# Patient Record
Sex: Female | Born: 1975 | Race: White | Hispanic: No | Marital: Married | Smoking: Never smoker
Health system: Southern US, Community
[De-identification: ages and names within clinical notes are randomized; demographics above are authoritative.]

## PROBLEM LIST (undated history)

## (undated) DIAGNOSIS — N6310 Unspecified lump in the right breast, unspecified quadrant: Secondary | ICD-10-CM

## (undated) DIAGNOSIS — D649 Anemia, unspecified: Secondary | ICD-10-CM

## (undated) DIAGNOSIS — K219 Gastro-esophageal reflux disease without esophagitis: Secondary | ICD-10-CM

## (undated) HISTORY — PX: NO PAST SURGERIES: SHX2092

---

## 1997-09-18 ENCOUNTER — Other Ambulatory Visit: Admission: RE | Admit: 1997-09-18 | Discharge: 1997-09-18 | Payer: Self-pay | Admitting: Obstetrics and Gynecology

## 1998-09-24 ENCOUNTER — Other Ambulatory Visit: Admission: RE | Admit: 1998-09-24 | Discharge: 1998-09-24 | Payer: Self-pay | Admitting: Obstetrics and Gynecology

## 1998-11-06 ENCOUNTER — Encounter: Payer: Self-pay | Admitting: Obstetrics and Gynecology

## 1998-11-06 ENCOUNTER — Ambulatory Visit (HOSPITAL_COMMUNITY): Admission: RE | Admit: 1998-11-06 | Discharge: 1998-11-06 | Payer: Self-pay | Admitting: Obstetrics and Gynecology

## 1998-11-19 ENCOUNTER — Other Ambulatory Visit: Admission: RE | Admit: 1998-11-19 | Discharge: 1998-11-19 | Payer: Self-pay | Admitting: Obstetrics and Gynecology

## 1999-07-27 ENCOUNTER — Other Ambulatory Visit: Admission: RE | Admit: 1999-07-27 | Discharge: 1999-07-27 | Payer: Self-pay | Admitting: Obstetrics and Gynecology

## 1999-07-28 ENCOUNTER — Other Ambulatory Visit: Admission: RE | Admit: 1999-07-28 | Discharge: 1999-07-28 | Payer: Self-pay | Admitting: Obstetrics and Gynecology

## 2000-02-16 ENCOUNTER — Other Ambulatory Visit: Admission: RE | Admit: 2000-02-16 | Discharge: 2000-02-16 | Payer: Self-pay | Admitting: Obstetrics and Gynecology

## 2000-02-17 ENCOUNTER — Other Ambulatory Visit: Admission: RE | Admit: 2000-02-17 | Discharge: 2000-02-17 | Payer: Self-pay | Admitting: Obstetrics and Gynecology

## 2000-07-28 ENCOUNTER — Other Ambulatory Visit: Admission: RE | Admit: 2000-07-28 | Discharge: 2000-07-28 | Payer: Self-pay | Admitting: Obstetrics and Gynecology

## 2000-08-04 ENCOUNTER — Other Ambulatory Visit: Admission: RE | Admit: 2000-08-04 | Discharge: 2000-08-04 | Payer: Self-pay | Admitting: Obstetrics and Gynecology

## 2000-12-07 ENCOUNTER — Other Ambulatory Visit: Admission: RE | Admit: 2000-12-07 | Discharge: 2000-12-07 | Payer: Self-pay | Admitting: Obstetrics and Gynecology

## 2001-03-06 ENCOUNTER — Inpatient Hospital Stay (HOSPITAL_COMMUNITY): Admission: AD | Admit: 2001-03-06 | Discharge: 2001-03-08 | Payer: Self-pay | Admitting: Obstetrics and Gynecology

## 2001-03-18 ENCOUNTER — Inpatient Hospital Stay (HOSPITAL_COMMUNITY): Admission: AD | Admit: 2001-03-18 | Discharge: 2001-03-18 | Payer: Self-pay | Admitting: Obstetrics and Gynecology

## 2001-04-06 ENCOUNTER — Ambulatory Visit (HOSPITAL_BASED_OUTPATIENT_CLINIC_OR_DEPARTMENT_OTHER): Admission: RE | Admit: 2001-04-06 | Discharge: 2001-04-06 | Payer: Self-pay | Admitting: *Deleted

## 2001-04-12 ENCOUNTER — Other Ambulatory Visit: Admission: RE | Admit: 2001-04-12 | Discharge: 2001-04-12 | Payer: Self-pay | Admitting: Obstetrics and Gynecology

## 2002-03-12 ENCOUNTER — Other Ambulatory Visit: Admission: RE | Admit: 2002-03-12 | Discharge: 2002-03-12 | Payer: Self-pay | Admitting: Obstetrics and Gynecology

## 2003-03-24 ENCOUNTER — Other Ambulatory Visit: Admission: RE | Admit: 2003-03-24 | Discharge: 2003-03-24 | Payer: Self-pay | Admitting: Obstetrics and Gynecology

## 2004-07-22 ENCOUNTER — Other Ambulatory Visit: Admission: RE | Admit: 2004-07-22 | Discharge: 2004-07-22 | Payer: Self-pay | Admitting: Obstetrics and Gynecology

## 2016-05-20 DIAGNOSIS — Z23 Encounter for immunization: Secondary | ICD-10-CM | POA: Diagnosis not present

## 2016-06-24 DIAGNOSIS — K219 Gastro-esophageal reflux disease without esophagitis: Secondary | ICD-10-CM | POA: Diagnosis not present

## 2016-06-24 DIAGNOSIS — R1011 Right upper quadrant pain: Secondary | ICD-10-CM | POA: Diagnosis not present

## 2016-06-24 DIAGNOSIS — R140 Abdominal distension (gaseous): Secondary | ICD-10-CM | POA: Diagnosis not present

## 2016-06-24 DIAGNOSIS — R634 Abnormal weight loss: Secondary | ICD-10-CM | POA: Diagnosis not present

## 2016-06-24 DIAGNOSIS — J45909 Unspecified asthma, uncomplicated: Secondary | ICD-10-CM | POA: Diagnosis not present

## 2016-06-27 ENCOUNTER — Other Ambulatory Visit: Payer: Self-pay | Admitting: Internal Medicine

## 2016-06-27 DIAGNOSIS — R946 Abnormal results of thyroid function studies: Secondary | ICD-10-CM | POA: Diagnosis not present

## 2016-06-27 DIAGNOSIS — R1011 Right upper quadrant pain: Secondary | ICD-10-CM

## 2016-07-05 ENCOUNTER — Ambulatory Visit
Admission: RE | Admit: 2016-07-05 | Discharge: 2016-07-05 | Disposition: A | Discharge status: Home / Self Care | Payer: BLUE CROSS/BLUE SHIELD | Source: Ambulatory Visit | Attending: Internal Medicine | Admitting: Internal Medicine

## 2016-07-05 DIAGNOSIS — R1011 Right upper quadrant pain: Secondary | ICD-10-CM

## 2016-07-05 DIAGNOSIS — R1084 Generalized abdominal pain: Secondary | ICD-10-CM | POA: Diagnosis not present

## 2016-07-14 DIAGNOSIS — K219 Gastro-esophageal reflux disease without esophagitis: Secondary | ICD-10-CM | POA: Diagnosis not present

## 2016-07-14 DIAGNOSIS — R140 Abdominal distension (gaseous): Secondary | ICD-10-CM | POA: Diagnosis not present

## 2016-07-14 DIAGNOSIS — J45909 Unspecified asthma, uncomplicated: Secondary | ICD-10-CM | POA: Diagnosis not present

## 2016-07-14 DIAGNOSIS — R946 Abnormal results of thyroid function studies: Secondary | ICD-10-CM | POA: Diagnosis not present

## 2016-07-25 DIAGNOSIS — Z01419 Encounter for gynecological examination (general) (routine) without abnormal findings: Secondary | ICD-10-CM | POA: Diagnosis not present

## 2016-07-25 DIAGNOSIS — Z6820 Body mass index (BMI) 20.0-20.9, adult: Secondary | ICD-10-CM | POA: Diagnosis not present

## 2016-08-15 DIAGNOSIS — Z1231 Encounter for screening mammogram for malignant neoplasm of breast: Secondary | ICD-10-CM | POA: Diagnosis not present

## 2016-08-18 ENCOUNTER — Other Ambulatory Visit: Payer: Self-pay | Admitting: Obstetrics and Gynecology

## 2016-08-18 DIAGNOSIS — R928 Other abnormal and inconclusive findings on diagnostic imaging of breast: Secondary | ICD-10-CM

## 2016-08-22 ENCOUNTER — Encounter: Payer: Self-pay | Admitting: Radiology

## 2016-08-22 ENCOUNTER — Other Ambulatory Visit: Payer: Self-pay | Admitting: Obstetrics and Gynecology

## 2016-08-22 ENCOUNTER — Ambulatory Visit
Admission: RE | Admit: 2016-08-22 | Discharge: 2016-08-22 | Disposition: A | Discharge status: Home / Self Care | Payer: BLUE CROSS/BLUE SHIELD | Source: Ambulatory Visit | Attending: Obstetrics and Gynecology | Admitting: Obstetrics and Gynecology

## 2016-08-22 DIAGNOSIS — R928 Other abnormal and inconclusive findings on diagnostic imaging of breast: Secondary | ICD-10-CM

## 2016-08-22 DIAGNOSIS — N6489 Other specified disorders of breast: Secondary | ICD-10-CM | POA: Diagnosis not present

## 2016-08-22 DIAGNOSIS — R922 Inconclusive mammogram: Secondary | ICD-10-CM | POA: Diagnosis not present

## 2016-08-23 ENCOUNTER — Other Ambulatory Visit: Payer: Self-pay | Admitting: Obstetrics and Gynecology

## 2016-08-23 DIAGNOSIS — R928 Other abnormal and inconclusive findings on diagnostic imaging of breast: Secondary | ICD-10-CM

## 2016-08-24 ENCOUNTER — Other Ambulatory Visit: Payer: Self-pay | Admitting: Obstetrics and Gynecology

## 2016-08-24 DIAGNOSIS — R928 Other abnormal and inconclusive findings on diagnostic imaging of breast: Secondary | ICD-10-CM

## 2016-09-05 ENCOUNTER — Ambulatory Visit
Admission: RE | Admit: 2016-09-05 | Discharge: 2016-09-05 | Disposition: A | Discharge status: Home / Self Care | Payer: BLUE CROSS/BLUE SHIELD | Source: Ambulatory Visit | Attending: Obstetrics and Gynecology | Admitting: Obstetrics and Gynecology

## 2016-09-05 DIAGNOSIS — R928 Other abnormal and inconclusive findings on diagnostic imaging of breast: Secondary | ICD-10-CM

## 2016-09-05 DIAGNOSIS — N6489 Other specified disorders of breast: Secondary | ICD-10-CM | POA: Diagnosis not present

## 2016-09-08 ENCOUNTER — Other Ambulatory Visit: Payer: Self-pay | Admitting: General Surgery

## 2016-09-08 DIAGNOSIS — N6489 Other specified disorders of breast: Secondary | ICD-10-CM

## 2016-09-09 ENCOUNTER — Other Ambulatory Visit: Payer: Self-pay | Admitting: General Surgery

## 2016-09-09 DIAGNOSIS — N6489 Other specified disorders of breast: Secondary | ICD-10-CM

## 2016-09-27 ENCOUNTER — Encounter (HOSPITAL_BASED_OUTPATIENT_CLINIC_OR_DEPARTMENT_OTHER): Payer: Self-pay | Admitting: *Deleted

## 2016-10-04 NOTE — Progress Notes (Signed)
Boost drink given with instructions to complete by 0515, pt verbalized understanding.

## 2016-10-05 ENCOUNTER — Ambulatory Visit
Admission: RE | Admit: 2016-10-05 | Discharge: 2016-10-05 | Disposition: A | Discharge status: Home / Self Care | Payer: BLUE CROSS/BLUE SHIELD | Source: Ambulatory Visit | Attending: General Surgery | Admitting: General Surgery

## 2016-10-05 DIAGNOSIS — R928 Other abnormal and inconclusive findings on diagnostic imaging of breast: Secondary | ICD-10-CM | POA: Diagnosis not present

## 2016-10-05 DIAGNOSIS — N6489 Other specified disorders of breast: Secondary | ICD-10-CM

## 2016-10-06 ENCOUNTER — Encounter (HOSPITAL_BASED_OUTPATIENT_CLINIC_OR_DEPARTMENT_OTHER)
Admission: RE | Disposition: A | Discharge status: Home / Self Care | Payer: Self-pay | Source: Ambulatory Visit | Attending: General Surgery

## 2016-10-06 ENCOUNTER — Ambulatory Visit
Admission: RE | Admit: 2016-10-06 | Discharge: 2016-10-06 | Disposition: A | Discharge status: Home / Self Care | Payer: BLUE CROSS/BLUE SHIELD | Source: Ambulatory Visit | Attending: General Surgery | Admitting: General Surgery

## 2016-10-06 ENCOUNTER — Ambulatory Visit (HOSPITAL_BASED_OUTPATIENT_CLINIC_OR_DEPARTMENT_OTHER): Payer: BLUE CROSS/BLUE SHIELD | Admitting: Anesthesiology

## 2016-10-06 ENCOUNTER — Encounter (HOSPITAL_BASED_OUTPATIENT_CLINIC_OR_DEPARTMENT_OTHER): Payer: Self-pay | Admitting: Anesthesiology

## 2016-10-06 ENCOUNTER — Ambulatory Visit (HOSPITAL_BASED_OUTPATIENT_CLINIC_OR_DEPARTMENT_OTHER)
Admission: RE | Admit: 2016-10-06 | Discharge: 2016-10-06 | Disposition: A | Discharge status: Home / Self Care | Payer: BLUE CROSS/BLUE SHIELD | Source: Ambulatory Visit | Attending: General Surgery | Admitting: General Surgery

## 2016-10-06 DIAGNOSIS — N6011 Diffuse cystic mastopathy of right breast: Secondary | ICD-10-CM | POA: Diagnosis not present

## 2016-10-06 DIAGNOSIS — D241 Benign neoplasm of right breast: Secondary | ICD-10-CM | POA: Diagnosis not present

## 2016-10-06 DIAGNOSIS — N6091 Unspecified benign mammary dysplasia of right breast: Secondary | ICD-10-CM | POA: Diagnosis not present

## 2016-10-06 DIAGNOSIS — N6489 Other specified disorders of breast: Secondary | ICD-10-CM | POA: Diagnosis not present

## 2016-10-06 DIAGNOSIS — K219 Gastro-esophageal reflux disease without esophagitis: Secondary | ICD-10-CM | POA: Diagnosis not present

## 2016-10-06 DIAGNOSIS — Z803 Family history of malignant neoplasm of breast: Secondary | ICD-10-CM | POA: Diagnosis not present

## 2016-10-06 DIAGNOSIS — R921 Mammographic calcification found on diagnostic imaging of breast: Secondary | ICD-10-CM | POA: Diagnosis not present

## 2016-10-06 DIAGNOSIS — N6310 Unspecified lump in the right breast, unspecified quadrant: Secondary | ICD-10-CM | POA: Diagnosis not present

## 2016-10-06 DIAGNOSIS — R928 Other abnormal and inconclusive findings on diagnostic imaging of breast: Secondary | ICD-10-CM | POA: Diagnosis not present

## 2016-10-06 HISTORY — DX: Gastro-esophageal reflux disease without esophagitis: K21.9

## 2016-10-06 HISTORY — DX: Anemia, unspecified: D64.9

## 2016-10-06 HISTORY — PX: RADIOACTIVE SEED GUIDED EXCISIONAL BREAST BIOPSY: SHX6490

## 2016-10-06 HISTORY — DX: Unspecified lump in the right breast, unspecified quadrant: N63.10

## 2016-10-06 SURGERY — RADIOACTIVE SEED GUIDED BREAST BIOPSY
Anesthesia: General | Site: Breast | Laterality: Right

## 2016-10-06 MED ORDER — FENTANYL CITRATE (PF) 100 MCG/2ML IJ SOLN
25.0000 ug | INTRAMUSCULAR | Status: DC | PRN
  Administered 2016-10-06 (×2): 25 ug via INTRAVENOUS

## 2016-10-06 MED ORDER — OXYCODONE HCL 5 MG/5ML PO SOLN: 5.0000 mg | Freq: Once | ORAL | Status: DC | PRN

## 2016-10-06 MED ORDER — LACTATED RINGERS IV SOLN
INTRAVENOUS | Status: DC
  Administered 2016-10-06: 08:00:00 via INTRAVENOUS

## 2016-10-06 MED ORDER — CEFAZOLIN SODIUM-DEXTROSE 2-4 GM/100ML-% IV SOLN
INTRAVENOUS | Status: AC
  Filled 2016-10-06: qty 100

## 2016-10-06 MED ORDER — LIDOCAINE 2% (20 MG/ML) 5 ML SYRINGE
INTRAMUSCULAR | Status: AC
  Filled 2016-10-06: qty 10

## 2016-10-06 MED ORDER — CELECOXIB 200 MG PO CAPS
200.0000 mg | ORAL_CAPSULE | ORAL | Status: AC
  Administered 2016-10-06: 200 mg via ORAL

## 2016-10-06 MED ORDER — ACETAMINOPHEN 500 MG PO TABS
1000.0000 mg | ORAL_TABLET | ORAL | Status: AC
  Administered 2016-10-06: 1000 mg via ORAL

## 2016-10-06 MED ORDER — GABAPENTIN 300 MG PO CAPS
ORAL_CAPSULE | ORAL | Status: AC
  Filled 2016-10-06: qty 1

## 2016-10-06 MED ORDER — BUPIVACAINE HCL (PF) 0.25 % IJ SOLN
INTRAMUSCULAR | Status: AC
  Filled 2016-10-06: qty 30

## 2016-10-06 MED ORDER — GABAPENTIN 300 MG PO CAPS
300.0000 mg | ORAL_CAPSULE | ORAL | Status: AC
  Administered 2016-10-06: 300 mg via ORAL

## 2016-10-06 MED ORDER — MIDAZOLAM HCL 2 MG/2ML IJ SOLN
1.0000 mg | INTRAMUSCULAR | Status: DC | PRN
  Administered 2016-10-06: 2 mg via INTRAVENOUS

## 2016-10-06 MED ORDER — PROPOFOL 10 MG/ML IV BOLUS
INTRAVENOUS | Status: DC | PRN
  Administered 2016-10-06: 200 mg via INTRAVENOUS

## 2016-10-06 MED ORDER — ONDANSETRON HCL 4 MG/2ML IJ SOLN: 4.0000 mg | Freq: Once | INTRAMUSCULAR | Status: DC | PRN

## 2016-10-06 MED ORDER — DEXAMETHASONE SODIUM PHOSPHATE 4 MG/ML IJ SOLN
INTRAMUSCULAR | Status: DC | PRN
  Administered 2016-10-06: 10 mg via INTRAVENOUS

## 2016-10-06 MED ORDER — OXYCODONE HCL 5 MG PO TABS: 5.0000 mg | ORAL_TABLET | Freq: Once | ORAL | Status: DC | PRN

## 2016-10-06 MED ORDER — FENTANYL CITRATE (PF) 100 MCG/2ML IJ SOLN
INTRAMUSCULAR | Status: AC
  Filled 2016-10-06: qty 2

## 2016-10-06 MED ORDER — CEFAZOLIN SODIUM-DEXTROSE 2-4 GM/100ML-% IV SOLN
2.0000 g | INTRAVENOUS | Status: AC
  Administered 2016-10-06: 2 g via INTRAVENOUS

## 2016-10-06 MED ORDER — DEXAMETHASONE SODIUM PHOSPHATE 10 MG/ML IJ SOLN
INTRAMUSCULAR | Status: AC
  Filled 2016-10-06: qty 1

## 2016-10-06 MED ORDER — ACETAMINOPHEN 500 MG PO TABS
ORAL_TABLET | ORAL | Status: AC
  Filled 2016-10-06: qty 2

## 2016-10-06 MED ORDER — BUPIVACAINE HCL (PF) 0.25 % IJ SOLN
INTRAMUSCULAR | Status: DC | PRN
  Administered 2016-10-06: 7 mL

## 2016-10-06 MED ORDER — CELECOXIB 200 MG PO CAPS
ORAL_CAPSULE | ORAL | Status: AC
  Filled 2016-10-06: qty 1

## 2016-10-06 MED ORDER — OXYCODONE-ACETAMINOPHEN 10-325 MG PO TABS
1.0000 | ORAL_TABLET | Freq: Four times a day (QID) | ORAL | 0 refills | Status: AC | PRN
Start: 2016-10-06 — End: 2017-10-06

## 2016-10-06 MED ORDER — LIDOCAINE 2% (20 MG/ML) 5 ML SYRINGE
INTRAMUSCULAR | Status: DC | PRN
  Administered 2016-10-06: 100 mg via INTRAVENOUS

## 2016-10-06 MED ORDER — ONDANSETRON HCL 4 MG/2ML IJ SOLN
INTRAMUSCULAR | Status: AC
  Filled 2016-10-06: qty 2

## 2016-10-06 MED ORDER — CHLORHEXIDINE GLUCONATE CLOTH 2 % EX PADS: 6.0000 | MEDICATED_PAD | Freq: Once | CUTANEOUS | Status: DC

## 2016-10-06 MED ORDER — SCOPOLAMINE 1 MG/3DAYS TD PT72
1.0000 | MEDICATED_PATCH | Freq: Once | TRANSDERMAL | Status: DC | PRN
Start: 2016-10-06 — End: 2016-10-06

## 2016-10-06 MED ORDER — MIDAZOLAM HCL 2 MG/2ML IJ SOLN
INTRAMUSCULAR | Status: AC
  Filled 2016-10-06: qty 2

## 2016-10-06 MED ORDER — FENTANYL CITRATE (PF) 100 MCG/2ML IJ SOLN
50.0000 ug | INTRAMUSCULAR | Status: DC | PRN
  Administered 2016-10-06: 100 ug via INTRAVENOUS

## 2016-10-06 SURGICAL SUPPLY — 58 items
ADH SKN CLS APL DERMABOND .7 (GAUZE/BANDAGES/DRESSINGS) ×1
BINDER BREAST LRG (GAUZE/BANDAGES/DRESSINGS) IMPLANT
BINDER BREAST MEDIUM (GAUZE/BANDAGES/DRESSINGS) ×2 IMPLANT
BINDER BREAST XLRG (GAUZE/BANDAGES/DRESSINGS) IMPLANT
BINDER BREAST XXLRG (GAUZE/BANDAGES/DRESSINGS) IMPLANT
BLADE SURG 15 STRL LF DISP TIS (BLADE) ×1 IMPLANT
BLADE SURG 15 STRL SS (BLADE) ×2
CANISTER SUC SOCK COL 7IN (MISCELLANEOUS) IMPLANT
CANISTER SUCT 1200ML W/VALVE (MISCELLANEOUS) IMPLANT
CHLORAPREP W/TINT 26ML (MISCELLANEOUS) ×2 IMPLANT
CLIP TI WIDE RED SMALL 6 (CLIP) IMPLANT
COVER BACK TABLE 60X90IN (DRAPES) ×2 IMPLANT
COVER MAYO STAND STRL (DRAPES) ×2 IMPLANT
COVER PROBE W GEL 5X96 (DRAPES) ×2 IMPLANT
DECANTER SPIKE VIAL GLASS SM (MISCELLANEOUS) IMPLANT
DERMABOND ADVANCED (GAUZE/BANDAGES/DRESSINGS) ×1
DERMABOND ADVANCED .7 DNX12 (GAUZE/BANDAGES/DRESSINGS) ×1 IMPLANT
DEVICE DUBIN W/COMP PLATE 8390 (MISCELLANEOUS) ×2 IMPLANT
DRAPE LAPAROSCOPIC ABDOMINAL (DRAPES) ×2 IMPLANT
DRAPE UTILITY XL STRL (DRAPES) ×2 IMPLANT
DRSG TEGADERM 4X4.75 (GAUZE/BANDAGES/DRESSINGS) IMPLANT
ELECT COATED BLADE 2.86 ST (ELECTRODE) ×2 IMPLANT
ELECT REM PT RETURN 9FT ADLT (ELECTROSURGICAL) ×2
ELECTRODE REM PT RTRN 9FT ADLT (ELECTROSURGICAL) ×1 IMPLANT
GAUZE SPONGE 4X4 12PLY STRL LF (GAUZE/BANDAGES/DRESSINGS) IMPLANT
GLOVE BIO SURGEON STRL SZ 6.5 (GLOVE) ×2 IMPLANT
GLOVE BIO SURGEON STRL SZ7 (GLOVE) ×4 IMPLANT
GLOVE BIOGEL PI IND STRL 6.5 (GLOVE) ×1 IMPLANT
GLOVE BIOGEL PI IND STRL 7.5 (GLOVE) ×2 IMPLANT
GLOVE BIOGEL PI INDICATOR 6.5 (GLOVE) ×1
GLOVE BIOGEL PI INDICATOR 7.5 (GLOVE) ×2
GLOVE SURG SS PI 7.0 STRL IVOR (GLOVE) ×2 IMPLANT
GOWN STRL REUS W/ TWL LRG LVL3 (GOWN DISPOSABLE) ×3 IMPLANT
GOWN STRL REUS W/TWL LRG LVL3 (GOWN DISPOSABLE) ×6
HEMOSTAT ARISTA ABSORB 3G PWDR (MISCELLANEOUS) IMPLANT
ILLUMINATOR WAVEGUIDE N/F (MISCELLANEOUS) IMPLANT
KIT MARKER MARGIN INK (KITS) ×2 IMPLANT
LIGHT WAVEGUIDE WIDE FLAT (MISCELLANEOUS) IMPLANT
NEEDLE HYPO 25X1 1.5 SAFETY (NEEDLE) ×2 IMPLANT
NS IRRIG 1000ML POUR BTL (IV SOLUTION) IMPLANT
PACK BASIN DAY SURGERY FS (CUSTOM PROCEDURE TRAY) ×2 IMPLANT
PENCIL BUTTON HOLSTER BLD 10FT (ELECTRODE) ×2 IMPLANT
SLEEVE SCD COMPRESS KNEE MED (MISCELLANEOUS) ×2 IMPLANT
SPONGE LAP 4X18 X RAY DECT (DISPOSABLE) ×2 IMPLANT
STRIP CLOSURE SKIN 1/2X4 (GAUZE/BANDAGES/DRESSINGS) ×2 IMPLANT
SUT MNCRL AB 4-0 PS2 18 (SUTURE) IMPLANT
SUT MON AB 5-0 PS2 18 (SUTURE) ×2 IMPLANT
SUT SILK 2 0 SH (SUTURE) IMPLANT
SUT VIC AB 2-0 SH 27 (SUTURE) ×1
SUT VIC AB 2-0 SH 27XBRD (SUTURE) ×1 IMPLANT
SUT VIC AB 3-0 SH 27 (SUTURE) ×2
SUT VIC AB 3-0 SH 27X BRD (SUTURE) ×1 IMPLANT
SUT VIC AB 5-0 PS2 18 (SUTURE) IMPLANT
SYR CONTROL 10ML LL (SYRINGE) ×2 IMPLANT
TOWEL OR 17X24 6PK STRL BLUE (TOWEL DISPOSABLE) ×2 IMPLANT
TOWEL OR NON WOVEN STRL DISP B (DISPOSABLE) ×2 IMPLANT
TUBE CONNECTING 20X1/4 (TUBING) IMPLANT
YANKAUER SUCT BULB TIP NO VENT (SUCTIONS) IMPLANT

## 2016-10-06 NOTE — Anesthesia Procedure Notes (Signed)
Procedure Name: LMA Insertion Date/Time: 10/06/2016 9:45 AM Performed by: Caren Macadam Pre-anesthesia Checklist: Patient identified, Emergency Drugs available, Suction available and Patient being monitored Patient Re-evaluated:Patient Re-evaluated prior to inductionOxygen Delivery Method: Circle system utilized Preoxygenation: Pre-oxygenation with 100% oxygen Intubation Type: IV induction Ventilation: Mask ventilation without difficulty LMA: LMA inserted LMA Size: 4.0 Number of attempts: 1 Airway Equipment and Method: Bite block Placement Confirmation: positive ETCO2 and breath sounds checked- equal and bilateral Tube secured with: Tape Dental Injury: Teeth and Oropharynx as per pre-operative assessment

## 2016-10-06 NOTE — Op Note (Signed)
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 Matt Dalonte Hardage Anesthesia: Gen. Estimated blood loss: minimal Complications: None Drains: None Specimens:Rightbreast tissue with paint Sponge and needle count correct at completion Disposition to recovery stable  Indications: This is a 41 yof who presents with mm that shows a csl.  We discussed options and elected to proceed with seed guided excisional biopsy.  Procedure: After informed consent was obtained she was then taken to the operating room. She was given cefazolin. Sequential compression devices were on her legs. She was placed under general anesthesia without complication. Her right breast was then prepped and draped in the standard sterile surgical fashion. A surgical timeout was then performed.  I located the radioactive seed with the neoprobe. I infiltrated marcaine in the area of the seed. I made a periareolar incision to hide the scar. I then used the neoprobe to guide the excision of the seed and surrounding tissue.This was confirmed by the neoprobe. This was then taken for mammogram which confirmed removal of the seed and the clip. This was confirmed by radiology. This was then sent to pathology. Hemostasis was observed.I closed the breast tissue with a 2-0 Vicryl. The dermis was closed with 3-0 Vicryl and the skin with 5-0 Monocryl.Dermabond and steristrips were placed on the incision. A breast binder was placed. She was transferred to recovery stable 

## 2016-10-06 NOTE — H&P (Signed)
88 yof who has fh of what sounds like dcis in her mom but without personal history of breast disease is referred by Dr Candice Camp for right breast mm abnormality. she had no mass or dc, was screening mm. she has d density breasts. she has upper inner right breast distortion noted and a lobulated obscured mass in uoq on mm. US shows dense fg tissue in upper inner breast with some benign cysts near this. the uoq on [place] is 2 adjacent nl nodes. she underwent stereo biopsy of the uiq mass and this is a csl. she is here with her husband to discuss options.   Past Surgical History Ethlyn Gallery, CMA; 09/05/2016 3:44 PM) No pertinent past surgical history   Diagnostic Studies History Ethlyn Gallery, CMA; 09/05/2016 3:44 PM) Colonoscopy  never Mammogram  within last year Pap Smear  1-5 years ago  Allergies Ethlyn Gallery, CMA; 09/05/2016 3:45 PM) No Known Drug Allergies 09/05/2016  Medication History (Alisha Spillers, CMA; 09/05/2016 3:45 PM) Pantoprazole Sodium (  Tablet DR, Oral) Active. Medications Reconciled  Social History Ethlyn Gallery, CMA; 09/05/2016 3:44 PM) Alcohol use  Occasional alcohol use. Caffeine use  Tea. Tobacco use  Never smoker.  Family History Ethlyn Gallery, CMA; 09/05/2016 3:44 PM) Breast Cancer  Mother. Thyroid problems  Father.  Pregnancy / Birth History Ethlyn Gallery, CMA; 09/05/2016 3:44 PM) Age at menarche  14 years. Contraceptive History  Oral contraceptives. Gravida  1 Length (months) of breastfeeding  3-6 Maternal age  73-30 Para  1 Regular periods   Other Problems Ethlyn Gallery, CMA; 09/05/2016 3:44 PM) Gastroesophageal Reflux Disease     Review of Systems Elease Hashimoto Spillers CMA; 09/05/2016 3:44 PM) General Not Present- Appetite Loss, Chills, Fatigue, Fever, Night Sweats, Weight Gain and Weight Loss. HEENT Not Present- Earache, Hearing Loss, Hoarseness, Nose Bleed, Oral Ulcers, Ringing in the Ears, Seasonal  Allergies, Sinus Pain, Sore Throat, Visual Disturbances, Wears glasses/contact lenses and Yellow Eyes. Respiratory Not Present- Bloody sputum, Chronic Cough, Difficulty Breathing, Snoring and Wheezing. Breast Present- Breast Mass. Not Present- Breast Pain, Nipple Discharge and Skin Changes. Cardiovascular Not Present- Chest Pain, Difficulty Breathing Lying Down, Leg Cramps, Palpitations, Rapid Heart Rate, Shortness of Breath and Swelling of Extremities. Female Genitourinary Not Present- Frequency, Nocturia, Painful Urination, Pelvic Pain and Urgency. Musculoskeletal Not Present- Back Pain, Joint Pain, Joint Stiffness, Muscle Pain, Muscle Weakness and Swelling of Extremities. Neurological Not Present- Decreased Memory, Fainting, Headaches, Numbness, Seizures, Tingling, Tremor, Trouble walking and Weakness. Psychiatric Not Present- Anxiety, Bipolar, Change in Sleep Pattern, Depression, Fearful and Frequent crying. Endocrine Not Present- Cold Intolerance, Excessive Hunger, Hair Changes, Heat Intolerance, Hot flashes and New Diabetes. Hematology Not Present- Blood Thinners, Easy Bruising, Excessive bleeding, Gland problems, HIV and Persistent Infections.  Vitals (Alisha Spillers CMA; 09/05/2016 3:45 PM) 09/05/2016 3:45 PM Weight: 129 lb Height: 66in Body Surface Area: 1.66 m Body Mass Index: 20.82 kg/m  BP: 110/60 (Sitting, Left Arm, Standard)       Physical Exam Emelia Loron MD; 09/05/2016 6:04 PM) General Mental Status-Alert. Orientation-Oriented X3.  Chest and Lung Exam Chest and lung exam reveals -on auscultation, normal breath sounds, no adventitious sounds and normal vocal resonance.  Breast Nipples-No Discharge. Breast Lump-No Palpable Breast Mass. Note: right breast uiq about 2.5 cm from areolar border hematoma   Cardiovascular Cardiovascular examination reveals -normal heart sounds, regular rate and rhythm with no murmurs.  Lymphatic Head &  Neck  General Head & Neck Lymphatics: Bilateral - Description - Normal. Axillary  General Axillary  Region: Bilateral - Description - Normal. 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 I think with csl and chance of upgrade to even atypia that would prompt change in mgt it is reasonable to excise. her risk breast cancer increased with density and family history. we discussed seed guided excision in about 3 weeks to let hematoma resolve. will schedule for few weeks out

## 2016-10-06 NOTE — Anesthesia Preprocedure Evaluation (Signed)

## 2016-10-06 NOTE — Discharge Instructions (Signed)
[facility] °Office Phone Number [phone] ° °POST OP INSTRUCTIONS ° °Always review your discharge instruction sheet given to you by the facility where your surgery was performed. ° °IF YOU HAVE DISABILITY OR FAMILY LEAVE FORMS, YOU MUST BRING THEM TO THE OFFICE FOR PROCESSING.  DO NOT GIVE THEM TO YOUR DOCTOR. ° °1. A prescription for pain medication may be given to you upon discharge.  Take your pain medication as prescribed, if needed.  If narcotic pain medicine is not needed, then you may take acetaminophen (Tylenol), naprosyn (Alleve) or ibuprofen (Advil) as needed. °2. Take your usually prescribed medications unless otherwise directed °3. If you need a refill on your pain medication, please contact your pharmacy.  They will contact our office to request authorization.  Prescriptions will not be filled after 5pm or on week-ends. °4. You should eat very light the first 24 hours after surgery, such as soup, crackers, pudding, etc.  Resume your normal diet the day after surgery. °5. Most patients will experience some swelling and bruising in the breast.  Ice packs and a good support bra will help.  Wear the breast binder provided or a sports bra for 72 hours day and night.  After that wear a sports bra during the day until you return to the office. Swelling and bruising can take several days to resolve.  °6. It is common to experience some constipation if taking pain medication after surgery.  Increasing fluid intake and taking a stool softener will usually help or prevent this problem from occurring.  A mild laxative (Milk of Magnesia or Miralax) should be taken according to package directions if there are no bowel movements after 48 hours. °7. Unless discharge instructions indicate otherwise, you may remove your bandages 48 hours after surgery and you may shower at that time.  You may have steri-strips (small skin tapes) in place directly over the incision.  These strips should be left on the  skin for 7-10 days and will come off on their own.  If your surgeon used skin glue on the incision, you may shower in 24 hours.  The glue will flake off over the next 2-3 weeks.  Any sutures or staples will be removed at the office during your follow-up visit. °8. ACTIVITIES:  You may resume regular daily activities (gradually increasing) beginning the next day.  Wearing a good support bra or sports bra minimizes pain and swelling.  You may have sexual intercourse when it is comfortable. °a. You may drive when you no longer are taking prescription pain medication, you can comfortably wear a seatbelt, and you can safely maneuver your car and apply brakes. °b. RETURN TO WORK:  ______________________________________________________________________________________ °9. You should see your doctor in the office for a follow-up appointment approximately two weeks after your surgery.  Your doctor’s nurse will typically make your follow-up appointment when she calls you with your pathology report.  Expect your pathology report 3-4 business days after your surgery.  You may call to check if you do not hear from us after three days. °10. OTHER INSTRUCTIONS: _______________________________________________________________________________________________ _____________________________________________________________________________________________________________________________________ °_____________________________________________________________________________________________________________________________________ °_____________________________________________________________________________________________________________________________________ ° °WHEN TO CALL DR WAKEFIELD: °1. Fever over 101.0 °2. Nausea and/or vomiting. °3. Extreme swelling or bruising. °4. Continued bleeding from incision. °5. Increased pain, redness, or drainage from the incision. ° °The clinic staff is available to answer your questions during regular  business hours.  Please don’t hesitate to call and ask to speak to one of the nurses for clinical concerns.  If   you have a medical emergency, go to the nearest emergency room or call 911.  A surgeon from [facility] is always on call at the hospital. ° °For further questions, please visit [email or web address] ° ° ° ° ° °Post Anesthesia Home Care Instructions ° °Activity: °Get plenty of rest for the remainder of the day. A responsible individual must stay with you for 24 hours following the procedure.  °For the next 24 hours, DO NOT: °-Drive a car °-Operate machinery °-Drink alcoholic beverages °-Take any medication unless instructed by your physician °-Make any legal decisions or sign important papers. ° °Meals: °Start with liquid foods such as gelatin or soup. Progress to regular foods as tolerated. Avoid greasy, spicy, heavy foods. If nausea and/or vomiting occur, drink only clear liquids until the nausea and/or vomiting subsides. Call your physician if vomiting continues. ° °Special Instructions/Symptoms: °Your throat may feel dry or sore from the anesthesia or the breathing tube placed in your throat during surgery. If this causes discomfort, gargle with warm salt water. The discomfort should disappear within 24 hours. ° °If you had a scopolamine patch placed behind your ear for the management of post- operative nausea and/or vomiting: ° °1. The medication in the patch is effective for 72 hours, after which it should be removed.  Wrap patch in a tissue and discard in the trash. Wash hands thoroughly with soap and water. °2. You may remove the patch earlier than 72 hours if you experience unpleasant side effects which may include dry mouth, dizziness or visual disturbances. °3. Avoid touching the patch. Wash your hands with soap and water after contact with the patch. °  ° °

## 2016-10-06 NOTE — Interval H&P Note (Signed)
History and Physical Interval Note:  10/06/2016 9:17 AM  Jaclyn [name]  has presented today for surgery, with the diagnosis of RIGHT BREAST MASS  The various methods of treatment have been discussed with the patient and family. After consideration of risks, benefits and other options for treatment, the patient has consented to  Procedure(s): RADIOACTIVE SEED GUIDED EXCISIONAL RIGHT BREAST BIOPSY (Right) as a surgical intervention .  The patient's history has been reviewed, patient examined, no change in status, stable for surgery.  I have reviewed the patient's chart and labs.  Questions were answered to the patient's satisfaction.     Suman Trivedi

## 2016-10-06 NOTE — Progress Notes (Signed)
Patient ID: Jaclyn [name], female   DOB: September 22, 1975, 41 y.o.   MRN: [id number] I reviewed [facility] database and she is not present.

## 2016-10-06 NOTE — Transfer of Care (Signed)
Immediate Anesthesia Transfer of Care Note  Patient: Jaclyn [name]  Procedure(s) Performed: Procedure(s): RADIOACTIVE SEED GUIDED EXCISIONAL RIGHT BREAST BIOPSY (Right)  Patient Location: PACU  Anesthesia Type:General  Level of Consciousness: sedated  Airway & Oxygen Therapy: Patient Spontanous Breathing and Patient connected to face mask oxygen  Post-op Assessment: Report given to RN and Post -op Vital signs reviewed and stable  Post vital signs: Reviewed and stable  Last Vitals:  Vitals:   10/06/16 0759  BP: 117/66  Pulse: 70  Resp: 16  Temp: 36.9 C    Last Pain:  Vitals:   10/06/16 0759  TempSrc: Oral         Complications: No apparent anesthesia complications

## 2016-10-06 NOTE — Anesthesia Postprocedure Evaluation (Addendum)
Anesthesia Post Note  Patient: Corinne Ports  Procedure(s) Performed: Procedure(s) (LRB): RADIOACTIVE SEED GUIDED EXCISIONAL RIGHT BREAST BIOPSY (Right)  Patient location during evaluation: PACU Anesthesia Type: General Level of consciousness: awake, awake and alert and oriented Pain management: pain level controlled Vital Signs Assessment: post-procedure vital signs reviewed and stable Respiratory status: spontaneous breathing, nonlabored ventilation and respiratory function stable Cardiovascular status: blood pressure returned to baseline Postop Assessment: no headache Anesthetic complications: no       Last Vitals:  Vitals:   10/06/16 1139 10/06/16 1150  BP:  115/71  Pulse: 61 66  Resp: 14 16  Temp:  36.7 C    Last Pain:  Vitals:   10/06/16 1150  TempSrc: Oral  PainSc: 3                  Luverna Degenhart COKER

## 2016-10-07 ENCOUNTER — Encounter (HOSPITAL_BASED_OUTPATIENT_CLINIC_OR_DEPARTMENT_OTHER): Payer: Self-pay | Admitting: General Surgery

## 2017-01-10 DIAGNOSIS — R946 Abnormal results of thyroid function studies: Secondary | ICD-10-CM | POA: Diagnosis not present

## 2017-01-10 DIAGNOSIS — J45998 Other asthma: Secondary | ICD-10-CM | POA: Diagnosis not present

## 2017-01-10 DIAGNOSIS — D508 Other iron deficiency anemias: Secondary | ICD-10-CM | POA: Diagnosis not present

## 2017-01-10 DIAGNOSIS — J4520 Mild intermittent asthma, uncomplicated: Secondary | ICD-10-CM | POA: Diagnosis not present

## 2017-01-10 DIAGNOSIS — K219 Gastro-esophageal reflux disease without esophagitis: Secondary | ICD-10-CM | POA: Diagnosis not present

## 2017-02-09 NOTE — Addendum Note (Signed)
Addendum  created 02/09/17 1325 by Rashanda Magloire, MD   Sign clinical note    

## 2017-08-21 ENCOUNTER — Other Ambulatory Visit: Payer: Self-pay | Admitting: Obstetrics and Gynecology

## 2017-08-21 DIAGNOSIS — Z1231 Encounter for screening mammogram for malignant neoplasm of breast: Secondary | ICD-10-CM

## 2017-09-07 ENCOUNTER — Ambulatory Visit
Admission: RE | Admit: 2017-09-07 | Discharge: 2017-09-07 | Disposition: A | Discharge status: Home / Self Care | Payer: BLUE CROSS/BLUE SHIELD | Source: Ambulatory Visit | Attending: Obstetrics and Gynecology | Admitting: Obstetrics and Gynecology

## 2017-09-07 DIAGNOSIS — Z1231 Encounter for screening mammogram for malignant neoplasm of breast: Secondary | ICD-10-CM

## 2017-09-19 DIAGNOSIS — Z01419 Encounter for gynecological examination (general) (routine) without abnormal findings: Secondary | ICD-10-CM | POA: Diagnosis not present

## 2017-09-19 DIAGNOSIS — Z6821 Body mass index (BMI) 21.0-21.9, adult: Secondary | ICD-10-CM | POA: Diagnosis not present

## 2017-12-12 DIAGNOSIS — L821 Other seborrheic keratosis: Secondary | ICD-10-CM | POA: Diagnosis not present

## 2017-12-12 DIAGNOSIS — B078 Other viral warts: Secondary | ICD-10-CM | POA: Diagnosis not present

## 2017-12-12 DIAGNOSIS — L7211 Pilar cyst: Secondary | ICD-10-CM | POA: Diagnosis not present

## 2017-12-12 DIAGNOSIS — D2261 Melanocytic nevi of right upper limb, including shoulder: Secondary | ICD-10-CM | POA: Diagnosis not present

## 2019-03-21 ENCOUNTER — Other Ambulatory Visit: Payer: Self-pay | Admitting: Obstetrics and Gynecology

## 2019-03-21 DIAGNOSIS — Z1231 Encounter for screening mammogram for malignant neoplasm of breast: Secondary | ICD-10-CM

## 2019-03-25 ENCOUNTER — Ambulatory Visit
Admission: RE | Admit: 2019-03-25 | Discharge: 2019-03-25 | Disposition: A | Discharge status: Home / Self Care | Payer: BC Managed Care – PPO | Source: Ambulatory Visit | Attending: Obstetrics and Gynecology | Admitting: Obstetrics and Gynecology

## 2019-03-25 ENCOUNTER — Other Ambulatory Visit: Payer: Self-pay

## 2019-03-25 DIAGNOSIS — Z1231 Encounter for screening mammogram for malignant neoplasm of breast: Secondary | ICD-10-CM

## 2019-03-27 ENCOUNTER — Other Ambulatory Visit: Payer: Self-pay | Admitting: Obstetrics and Gynecology

## 2019-03-27 DIAGNOSIS — R928 Other abnormal and inconclusive findings on diagnostic imaging of breast: Secondary | ICD-10-CM

## 2019-03-29 ENCOUNTER — Ambulatory Visit
Admission: RE | Admit: 2019-03-29 | Discharge: 2019-03-29 | Disposition: A | Discharge status: Home / Self Care | Payer: BC Managed Care – PPO | Source: Ambulatory Visit | Attending: Obstetrics and Gynecology | Admitting: Obstetrics and Gynecology

## 2019-03-29 ENCOUNTER — Other Ambulatory Visit: Payer: Self-pay

## 2019-03-29 ENCOUNTER — Other Ambulatory Visit: Payer: Self-pay | Admitting: Obstetrics and Gynecology

## 2019-03-29 DIAGNOSIS — R928 Other abnormal and inconclusive findings on diagnostic imaging of breast: Secondary | ICD-10-CM

## 2019-03-29 DIAGNOSIS — N6310 Unspecified lump in the right breast, unspecified quadrant: Secondary | ICD-10-CM

## 2019-03-29 DIAGNOSIS — R922 Inconclusive mammogram: Secondary | ICD-10-CM | POA: Diagnosis not present

## 2019-03-29 DIAGNOSIS — N6312 Unspecified lump in the right breast, upper inner quadrant: Secondary | ICD-10-CM | POA: Diagnosis not present

## 2019-03-29 DIAGNOSIS — N6011 Diffuse cystic mastopathy of right breast: Secondary | ICD-10-CM | POA: Diagnosis not present

## 2019-05-03 DIAGNOSIS — R928 Other abnormal and inconclusive findings on diagnostic imaging of breast: Secondary | ICD-10-CM | POA: Diagnosis not present

## 2019-05-08 ENCOUNTER — Other Ambulatory Visit: Payer: Self-pay | Admitting: General Surgery

## 2019-05-08 DIAGNOSIS — N6489 Other specified disorders of breast: Secondary | ICD-10-CM

## 2019-05-09 ENCOUNTER — Other Ambulatory Visit: Payer: Self-pay | Admitting: General Surgery

## 2019-05-09 DIAGNOSIS — N6489 Other specified disorders of breast: Secondary | ICD-10-CM

## 2019-05-28 DIAGNOSIS — Z6821 Body mass index (BMI) 21.0-21.9, adult: Secondary | ICD-10-CM | POA: Diagnosis not present

## 2019-05-28 DIAGNOSIS — Z01419 Encounter for gynecological examination (general) (routine) without abnormal findings: Secondary | ICD-10-CM | POA: Diagnosis not present

## 2019-06-12 ENCOUNTER — Encounter (HOSPITAL_BASED_OUTPATIENT_CLINIC_OR_DEPARTMENT_OTHER): Payer: Self-pay | Admitting: General Surgery

## 2019-06-12 ENCOUNTER — Other Ambulatory Visit: Payer: Self-pay

## 2019-06-18 NOTE — Progress Notes (Signed)

## 2019-06-21 HISTORY — PX: BREAST EXCISIONAL BIOPSY: SUR124

## 2019-06-22 ENCOUNTER — Other Ambulatory Visit (HOSPITAL_COMMUNITY)
Admission: RE | Admit: 2019-06-22 | Discharge: 2019-06-22 | Disposition: A | Discharge status: Home / Self Care | Payer: BC Managed Care – PPO | Source: Ambulatory Visit | Attending: General Surgery | Admitting: General Surgery

## 2019-06-22 ENCOUNTER — Other Ambulatory Visit: Payer: Self-pay | Admitting: General Surgery

## 2019-06-22 DIAGNOSIS — Z01812 Encounter for preprocedural laboratory examination: Secondary | ICD-10-CM | POA: Diagnosis not present

## 2019-06-22 DIAGNOSIS — Z20822 Contact with and (suspected) exposure to covid-19: Secondary | ICD-10-CM | POA: Diagnosis not present

## 2019-06-22 LAB — SARS CORONAVIRUS 2 (TAT 6-24 HRS): SARS Coronavirus 2: NEGATIVE

## 2019-06-25 ENCOUNTER — Other Ambulatory Visit: Payer: Self-pay

## 2019-06-25 ENCOUNTER — Ambulatory Visit
Admission: RE | Admit: 2019-06-25 | Discharge: 2019-06-25 | Disposition: A | Discharge status: Home / Self Care | Payer: BC Managed Care – PPO | Source: Ambulatory Visit | Attending: General Surgery | Admitting: General Surgery

## 2019-06-25 DIAGNOSIS — R928 Other abnormal and inconclusive findings on diagnostic imaging of breast: Secondary | ICD-10-CM | POA: Diagnosis not present

## 2019-06-25 DIAGNOSIS — N6489 Other specified disorders of breast: Secondary | ICD-10-CM

## 2019-06-26 ENCOUNTER — Ambulatory Visit (HOSPITAL_BASED_OUTPATIENT_CLINIC_OR_DEPARTMENT_OTHER): Payer: BC Managed Care – PPO | Admitting: Anesthesiology

## 2019-06-26 ENCOUNTER — Encounter (HOSPITAL_BASED_OUTPATIENT_CLINIC_OR_DEPARTMENT_OTHER): Payer: Self-pay | Admitting: General Surgery

## 2019-06-26 ENCOUNTER — Encounter (HOSPITAL_BASED_OUTPATIENT_CLINIC_OR_DEPARTMENT_OTHER)
Admission: RE | Disposition: A | Discharge status: Home / Self Care | Payer: Self-pay | Source: Home / Self Care | Attending: General Surgery

## 2019-06-26 ENCOUNTER — Other Ambulatory Visit: Payer: Self-pay

## 2019-06-26 ENCOUNTER — Ambulatory Visit
Admission: RE | Admit: 2019-06-26 | Discharge: 2019-06-26 | Disposition: A | Discharge status: Home / Self Care | Payer: BC Managed Care – PPO | Source: Ambulatory Visit | Attending: General Surgery | Admitting: General Surgery

## 2019-06-26 ENCOUNTER — Ambulatory Visit (HOSPITAL_BASED_OUTPATIENT_CLINIC_OR_DEPARTMENT_OTHER)
Admission: RE | Admit: 2019-06-26 | Discharge: 2019-06-26 | Disposition: A | Discharge status: Home / Self Care | Payer: BC Managed Care – PPO | Attending: General Surgery | Admitting: General Surgery

## 2019-06-26 DIAGNOSIS — D649 Anemia, unspecified: Secondary | ICD-10-CM | POA: Diagnosis not present

## 2019-06-26 DIAGNOSIS — K219 Gastro-esophageal reflux disease without esophagitis: Secondary | ICD-10-CM | POA: Insufficient documentation

## 2019-06-26 DIAGNOSIS — N6489 Other specified disorders of breast: Secondary | ICD-10-CM | POA: Insufficient documentation

## 2019-06-26 DIAGNOSIS — Z79899 Other long term (current) drug therapy: Secondary | ICD-10-CM | POA: Diagnosis not present

## 2019-06-26 DIAGNOSIS — R928 Other abnormal and inconclusive findings on diagnostic imaging of breast: Secondary | ICD-10-CM | POA: Diagnosis not present

## 2019-06-26 DIAGNOSIS — N6011 Diffuse cystic mastopathy of right breast: Secondary | ICD-10-CM | POA: Diagnosis not present

## 2019-06-26 DIAGNOSIS — D241 Benign neoplasm of right breast: Secondary | ICD-10-CM | POA: Diagnosis not present

## 2019-06-26 DIAGNOSIS — N6091 Unspecified benign mammary dysplasia of right breast: Secondary | ICD-10-CM | POA: Diagnosis not present

## 2019-06-26 HISTORY — PX: RADIOACTIVE SEED GUIDED EXCISIONAL BREAST BIOPSY: SHX6490

## 2019-06-26 LAB — POCT PREGNANCY, URINE: Preg Test, Ur: NEGATIVE

## 2019-06-26 SURGERY — RADIOACTIVE SEED GUIDED BREAST BIOPSY
Anesthesia: General | Site: Breast | Laterality: Right

## 2019-06-26 MED ORDER — ACETAMINOPHEN 160 MG/5ML PO SOLN: 325.0000 mg | ORAL | Status: DC | PRN

## 2019-06-26 MED ORDER — GABAPENTIN 100 MG PO CAPS
ORAL_CAPSULE | ORAL | Status: AC
  Filled 2019-06-26: qty 1

## 2019-06-26 MED ORDER — ACETAMINOPHEN 325 MG PO TABS: 325.0000 mg | ORAL_TABLET | ORAL | Status: DC | PRN

## 2019-06-26 MED ORDER — KETOROLAC TROMETHAMINE 15 MG/ML IJ SOLN
INTRAMUSCULAR | Status: AC
  Filled 2019-06-26: qty 1

## 2019-06-26 MED ORDER — FENTANYL CITRATE (PF) 100 MCG/2ML IJ SOLN
INTRAMUSCULAR | Status: AC
  Filled 2019-06-26: qty 2

## 2019-06-26 MED ORDER — OXYCODONE HCL 5 MG/5ML PO SOLN: 5.0000 mg | Freq: Once | ORAL | Status: DC | PRN

## 2019-06-26 MED ORDER — CEFAZOLIN SODIUM-DEXTROSE 2-4 GM/100ML-% IV SOLN
2.0000 g | INTRAVENOUS | Status: AC
  Administered 2019-06-26: 2 g via INTRAVENOUS

## 2019-06-26 MED ORDER — GABAPENTIN 100 MG PO CAPS
100.0000 mg | ORAL_CAPSULE | ORAL | Status: AC
  Administered 2019-06-26: 100 mg via ORAL

## 2019-06-26 MED ORDER — DEXAMETHASONE SODIUM PHOSPHATE 10 MG/ML IJ SOLN
INTRAMUSCULAR | Status: DC | PRN
  Administered 2019-06-26: 5 mg via INTRAVENOUS

## 2019-06-26 MED ORDER — ONDANSETRON HCL 4 MG/2ML IJ SOLN: 4.0000 mg | Freq: Once | INTRAMUSCULAR | Status: DC | PRN

## 2019-06-26 MED ORDER — CEFAZOLIN SODIUM-DEXTROSE 2-4 GM/100ML-% IV SOLN
INTRAVENOUS | Status: AC
  Filled 2019-06-26: qty 100

## 2019-06-26 MED ORDER — FENTANYL CITRATE (PF) 100 MCG/2ML IJ SOLN: 25.0000 ug | INTRAMUSCULAR | Status: DC | PRN

## 2019-06-26 MED ORDER — PROPOFOL 10 MG/ML IV BOLUS
INTRAVENOUS | Status: DC | PRN
  Administered 2019-06-26: 200 mg via INTRAVENOUS

## 2019-06-26 MED ORDER — KETOROLAC TROMETHAMINE 15 MG/ML IJ SOLN
15.0000 mg | INTRAMUSCULAR | Status: AC
  Administered 2019-06-26: 15 mg via INTRAVENOUS

## 2019-06-26 MED ORDER — ACETAMINOPHEN 500 MG PO TABS
ORAL_TABLET | ORAL | Status: AC
  Filled 2019-06-26: qty 2

## 2019-06-26 MED ORDER — MEPERIDINE HCL 25 MG/ML IJ SOLN: 6.2500 mg | INTRAMUSCULAR | Status: DC | PRN

## 2019-06-26 MED ORDER — MIDAZOLAM HCL 5 MG/5ML IJ SOLN
INTRAMUSCULAR | Status: DC | PRN
  Administered 2019-06-26: 2 mg via INTRAVENOUS

## 2019-06-26 MED ORDER — ENSURE PRE-SURGERY PO LIQD: 296.0000 mL | Freq: Once | ORAL | Status: DC

## 2019-06-26 MED ORDER — LACTATED RINGERS IV SOLN: INTRAVENOUS | Status: DC

## 2019-06-26 MED ORDER — KETOROLAC TROMETHAMINE 30 MG/ML IJ SOLN: 30.0000 mg | Freq: Once | INTRAMUSCULAR | Status: DC | PRN

## 2019-06-26 MED ORDER — FENTANYL CITRATE (PF) 100 MCG/2ML IJ SOLN
INTRAMUSCULAR | Status: DC | PRN
  Administered 2019-06-26: 100 ug via INTRAVENOUS

## 2019-06-26 MED ORDER — LIDOCAINE HCL (CARDIAC) PF 100 MG/5ML IV SOSY
PREFILLED_SYRINGE | INTRAVENOUS | Status: DC | PRN
  Administered 2019-06-26: 100 mg via INTRAVENOUS

## 2019-06-26 MED ORDER — TRAMADOL HCL 50 MG PO TABS: 50.0000 mg | ORAL_TABLET | Freq: Four times a day (QID) | ORAL | 0 refills | Status: DC | PRN

## 2019-06-26 MED ORDER — ONDANSETRON HCL 4 MG/2ML IJ SOLN
INTRAMUSCULAR | Status: DC | PRN
  Administered 2019-06-26: 4 mg via INTRAVENOUS

## 2019-06-26 MED ORDER — ONDANSETRON HCL 4 MG/2ML IJ SOLN
INTRAMUSCULAR | Status: AC
  Filled 2019-06-26: qty 2

## 2019-06-26 MED ORDER — ACETAMINOPHEN 500 MG PO TABS
1000.0000 mg | ORAL_TABLET | ORAL | Status: AC
  Administered 2019-06-26: 1000 mg via ORAL

## 2019-06-26 MED ORDER — MIDAZOLAM HCL 2 MG/2ML IJ SOLN
INTRAMUSCULAR | Status: AC
  Filled 2019-06-26: qty 2

## 2019-06-26 MED ORDER — BUPIVACAINE HCL (PF) 0.25 % IJ SOLN
INTRAMUSCULAR | Status: DC | PRN
  Administered 2019-06-26: 10 mL

## 2019-06-26 MED ORDER — OXYCODONE HCL 5 MG PO TABS: 5.0000 mg | ORAL_TABLET | Freq: Once | ORAL | Status: DC | PRN

## 2019-06-26 SURGICAL SUPPLY — 63 items
APL PRP STRL LF DISP 70% ISPRP (MISCELLANEOUS) ×1
APPLIER CLIP 9.375 MED OPEN (MISCELLANEOUS)
BINDER BREAST LRG (GAUZE/BANDAGES/DRESSINGS) IMPLANT
BINDER BREAST MEDIUM (GAUZE/BANDAGES/DRESSINGS) ×1 IMPLANT
BINDER BREAST XLRG (GAUZE/BANDAGES/DRESSINGS) IMPLANT
BINDER BREAST XXLRG (GAUZE/BANDAGES/DRESSINGS) IMPLANT
BLADE SURG 15 STRL LF DISP TIS (BLADE) ×1 IMPLANT
BLADE SURG 15 STRL SS (BLADE) ×2
CANISTER SUC SOCK COL 7IN (MISCELLANEOUS) IMPLANT
CANISTER SUCT 1200ML W/VALVE (MISCELLANEOUS) ×1 IMPLANT
CHLORAPREP W/TINT 26 (MISCELLANEOUS) ×2 IMPLANT
CLIP APPLIE 9.375 MED OPEN (MISCELLANEOUS) IMPLANT
CLIP VESOCCLUDE SM WIDE 6/CT (CLIP) IMPLANT
COVER BACK TABLE REUSABLE LG (DRAPES) ×2 IMPLANT
COVER MAYO STAND REUSABLE (DRAPES) ×2 IMPLANT
COVER PROBE W GEL 5X96 (DRAPES) ×2 IMPLANT
COVER WAND RF STERILE (DRAPES) IMPLANT
DECANTER SPIKE VIAL GLASS SM (MISCELLANEOUS) IMPLANT
DERMABOND ADVANCED (GAUZE/BANDAGES/DRESSINGS) ×1
DERMABOND ADVANCED .7 DNX12 (GAUZE/BANDAGES/DRESSINGS) ×1 IMPLANT
DRAPE LAPAROSCOPIC ABDOMINAL (DRAPES) ×2 IMPLANT
DRAPE UTILITY XL STRL (DRAPES) ×2 IMPLANT
DRSG TEGADERM 4X4.75 (GAUZE/BANDAGES/DRESSINGS) IMPLANT
ELECT COATED BLADE 2.86 ST (ELECTRODE) ×2 IMPLANT
ELECT REM PT RETURN 9FT ADLT (ELECTROSURGICAL) ×2
ELECTRODE REM PT RTRN 9FT ADLT (ELECTROSURGICAL) ×1 IMPLANT
GAUZE SPONGE 4X4 12PLY STRL LF (GAUZE/BANDAGES/DRESSINGS) IMPLANT
GLOVE BIO SURGEON STRL SZ7 (GLOVE) ×3 IMPLANT
GLOVE BIOGEL PI IND STRL 6.5 (GLOVE) IMPLANT
GLOVE BIOGEL PI IND STRL 7.5 (GLOVE) ×1 IMPLANT
GLOVE BIOGEL PI INDICATOR 6.5 (GLOVE) ×1
GLOVE BIOGEL PI INDICATOR 7.5 (GLOVE) ×1
GLOVE ECLIPSE 6.5 STRL STRAW (GLOVE) ×1 IMPLANT
GLOVE EXAM NITRILE MD LF STRL (GLOVE) ×1 IMPLANT
GOWN STRL REUS W/ TWL LRG LVL3 (GOWN DISPOSABLE) ×2 IMPLANT
GOWN STRL REUS W/ TWL XL LVL3 (GOWN DISPOSABLE) IMPLANT
GOWN STRL REUS W/TWL LRG LVL3 (GOWN DISPOSABLE) ×2
GOWN STRL REUS W/TWL XL LVL3 (GOWN DISPOSABLE) ×2
HEMOSTAT ARISTA ABSORB 3G PWDR (HEMOSTASIS) IMPLANT
ILLUMINATOR WAVEGUIDE N/F (MISCELLANEOUS) IMPLANT
KIT MARKER MARGIN INK (KITS) ×2 IMPLANT
LIGHT WAVEGUIDE WIDE FLAT (MISCELLANEOUS) IMPLANT
NDL HYPO 25X1 1.5 SAFETY (NEEDLE) ×1 IMPLANT
NEEDLE HYPO 25X1 1.5 SAFETY (NEEDLE) ×2 IMPLANT
NS IRRIG 1000ML POUR BTL (IV SOLUTION) ×1 IMPLANT
PACK BASIN DAY SURGERY FS (CUSTOM PROCEDURE TRAY) ×2 IMPLANT
PENCIL SMOKE EVACUATOR (MISCELLANEOUS) ×2 IMPLANT
RETRACTOR ONETRAX LX 90X20 (MISCELLANEOUS) ×1 IMPLANT
SLEEVE SCD COMPRESS KNEE MED (MISCELLANEOUS) ×2 IMPLANT
SPONGE LAP 4X18 RFD (DISPOSABLE) ×3 IMPLANT
STRIP CLOSURE SKIN 1/2X4 (GAUZE/BANDAGES/DRESSINGS) ×2 IMPLANT
SUT MNCRL AB 4-0 PS2 18 (SUTURE) IMPLANT
SUT MON AB 5-0 PS2 18 (SUTURE) ×1 IMPLANT
SUT SILK 2 0 SH (SUTURE) IMPLANT
SUT VIC AB 2-0 SH 27 (SUTURE) ×2
SUT VIC AB 2-0 SH 27XBRD (SUTURE) ×1 IMPLANT
SUT VIC AB 3-0 SH 27 (SUTURE) ×2
SUT VIC AB 3-0 SH 27X BRD (SUTURE) ×1 IMPLANT
SYR CONTROL 10ML LL (SYRINGE) ×2 IMPLANT
TOWEL GREEN STERILE FF (TOWEL DISPOSABLE) ×2 IMPLANT
TRAY FAXITRON CT DISP (TRAY / TRAY PROCEDURE) ×2 IMPLANT
TUBE CONNECTING 20X1/4 (TUBING) ×1 IMPLANT
YANKAUER SUCT BULB TIP NO VENT (SUCTIONS) ×1 IMPLANT

## 2019-06-26 NOTE — Transfer of Care (Signed)
Immediate Anesthesia Transfer of Care Note  Patient: Jaclyn [name]  Procedure(s) Performed: RIGHT BREAST RADIOACTIVE SEED GUIDED EXCISIONAL BREAST BIOPSY (Right Breast)  Patient Location: PACU  Anesthesia Type:General  Level of Consciousness: awake, alert , oriented and patient cooperative  Airway & Oxygen Therapy: Patient Spontanous Breathing and Patient connected to face mask oxygen  Post-op Assessment: Report given to RN, Post -op Vital signs reviewed and stable and Patient moving all extremities X 4  Post vital signs: Reviewed and stable  Last Vitals:  Vitals Value Taken Time  BP    Temp    Pulse    Resp    SpO2      Last Pain:  Vitals:   06/26/19 0814  TempSrc: Temporal  PainSc: 0-No pain      Patients Stated Pain Goal: 5 (06/26/19 0814)  Complications: No apparent anesthesia complications

## 2019-06-26 NOTE — Interval H&P Note (Signed)
History and Physical Interval Note:  06/26/2019 10:18 AM  Jaclyn [name]  has presented today for surgery, with the diagnosis of RIGHT BREAST MASS.  The various methods of treatment have been discussed with the patient and family. After consideration of risks, benefits and other options for treatment, the patient has consented to  Procedure(s) with comments: RIGHT BREAST RADIOACTIVE SEED GUIDED EXCISIONAL BREAST BIOPSY (Right) - LMA as a surgical intervention.  The patient's history has been reviewed, patient examined, no change in status, stable for surgery.  I have reviewed the patient's chart and labs.  Questions were answered to the patient's satisfaction.     Emelia Loron

## 2019-06-26 NOTE — Anesthesia Postprocedure Evaluation (Signed)
Anesthesia Post Note  Patient: Jaclyn [name]  Procedure(s) Performed: RIGHT BREAST RADIOACTIVE SEED GUIDED EXCISIONAL BREAST BIOPSY (Right Breast)     Patient location during evaluation: Phase II Anesthesia Type: General Level of consciousness: awake Pain management: pain level controlled Vital Signs Assessment: post-procedure vital signs reviewed and stable Respiratory status: spontaneous breathing Cardiovascular status: stable Postop Assessment: no apparent nausea or vomiting Anesthetic complications: no    Last Vitals:  Vitals:   06/26/19 1145 06/26/19 1158  BP: 105/61   Pulse: 62 61  Resp: 14 15  Temp:    SpO2: 100% 100%    Last Pain:  Vitals:   06/26/19 1145  TempSrc:   PainSc: 4    Pain Goal: Patients Stated Pain Goal: 5 (06/26/19 1126)                 Caren Macadam

## 2019-06-26 NOTE — Anesthesia Procedure Notes (Signed)
Procedure Name: LMA Insertion Date/Time: 06/26/2019 10:34 AM Performed by: Shanon Payor, CRNA Pre-anesthesia Checklist: Patient identified, Emergency Drugs available, Suction available, Patient being monitored and Timeout performed Patient Re-evaluated:Patient Re-evaluated prior to induction Oxygen Delivery Method: Circle system utilized Preoxygenation: Pre-oxygenation with 100% oxygen Induction Type: IV induction LMA: LMA inserted LMA Size: 4.0

## 2019-06-26 NOTE — Op Note (Signed)
Preoperative diagnosis: Right breast mammographic distortion with discordant core biopsy Postoperative diagnosis: Same as above Procedure: Right breast radioactive seed guided excisional biopsy Surgeon: Dr. Harden Mo Anesthesia: General Estimated blood loss: 20 cc Complications: None Drains: None Specimens: Right breast tissue marked with paint Sponge needle count was correct at completion Disposition to recovery stable condition  Indications:  43 yof I know from prior right breast seed guided excision. this was a csl on core. excision showed a csl with pash. she has done fine since then. she had screening mm that shows d density breasts. there is possible asymmetry in the right breast. dx views confirm and on [place] there is a suspicious 2.4x3x1.1 cm mass in the 1230 region of the right breast. no axillary lad. 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 We discussed excision  Procedure: After informed consent was obtained the patient was given antibiotics.  SCDs were placed.  She was placed under general anesthesia without complication.  Her right breast was prepped and draped in the standard sterile surgical fashion.  A surgical timeout was then performed.  I identified the radioactive seed.  I infiltrated Marcaine along the superior border of the areola.  I then made a periareolar incision in order to hide the scar later.  I used the neoprobe to guide the removal of the seed and the surrounding tissue.  Mammogram was taken confirming removal of the seed and the clip.  Hemostasis was observed.  I closed this with 2-0 Vicryl, 3-0 Vicryl and, 5-0 Monocryl.  Glue was placed.  She tolerated this well was extubated transferred recovery stable

## 2019-06-26 NOTE — H&P (Signed)
The patient is a 44 year old female who presents with a breast mass. 43 yof I know from prior right breast seed guided excision. this was a csl on core. excision showed a csl with pash. she has done fine since then. she had screening mm that shows d density breasts. there is possible asymmetry in the right breast. dx views confirm and on [place] there is a suspicious 2.4x3x1.1 cm mass in the 1230 region of the right breast. no axillary lad. 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 she is here to discuss options today   Past Surgical History (Tanisha A. Manson Passey, RMA; 05/03/2019 9:47 AM) Breast Biopsy  Right. Breast Mass; Local Excision  Right.  Diagnostic Studies History (Tanisha A. Manson Passey, RMA; 05/03/2019 9:47 AM) Colonoscopy  never Mammogram  within last year Pap Smear  1-5 years ago  Allergies (Tanisha A. Manson Passey, RMA; 05/03/2019 9:48 AM) No Known Drug Allergies [09/05/2016]: Allergies Reconciled   Medication History (Tanisha A. Manson Passey, RMA; 05/03/2019 9:48 AM) No Current Medications Medications Reconciled  Social History (Tanisha A. Manson Passey, RMA; 05/03/2019 9:47 AM) Alcohol use  Occasional alcohol use. Caffeine use  Coffee, Tea. No drug use  Tobacco use  Never smoker.  Family History (Tanisha A. Manson Passey, RMA; 05/03/2019 9:47 AM) Breast Cancer  Mother.  Pregnancy / Birth History (Tanisha A. Manson Passey, RMA; 05/03/2019 9:47 AM) Contraceptive History  Oral contraceptives. Gravida  1 Length (months) of breastfeeding  3-6 Maternal age  74-30 Para  1 Regular periods     Review of Systems (Tanisha A. Brown RMA; 05/03/2019 9:47 AM) General Not Present- Appetite Loss, Chills, Fatigue, Fever, Night Sweats, Weight Gain and Weight Loss. Skin Not Present- Change in Wart/Mole, Dryness, Hives, Jaundice, New Lesions, Non-Healing Wounds, Rash and Ulcer. HEENT Not Present- Earache, Hearing Loss, Hoarseness, Nose Bleed, Oral Ulcers, Ringing in the Ears, Seasonal Allergies,  Sinus Pain, Sore Throat, Visual Disturbances, Wears glasses/contact lenses and Yellow Eyes. Respiratory Not Present- Bloody sputum, Chronic Cough, Difficulty Breathing, Snoring and Wheezing. Breast Present- Breast Mass. Not Present- Breast Pain, Nipple Discharge and Skin Changes. Cardiovascular Not Present- Chest Pain, Difficulty Breathing Lying Down, Leg Cramps, Palpitations, Rapid Heart Rate, Shortness of Breath and Swelling of Extremities. Gastrointestinal Not Present- Abdominal Pain, Bloating, Bloody Stool, Change in Bowel Habits, Chronic diarrhea, Constipation, Difficulty Swallowing, Excessive gas, Gets full quickly at meals, Hemorrhoids, Indigestion, Nausea, Rectal Pain and Vomiting. Female Genitourinary Not Present- Frequency, Nocturia, Painful Urination, Pelvic Pain and Urgency. Musculoskeletal Not Present- Back Pain, Joint Pain, Joint Stiffness, Muscle Pain, Muscle Weakness and Swelling of Extremities. Neurological Not Present- Decreased Memory, Fainting, Headaches, Numbness, Seizures, Tingling, Tremor, Trouble walking and Weakness. Psychiatric Not Present- Anxiety, Bipolar, Change in Sleep Pattern, Depression, Fearful and Frequent crying. Endocrine Not Present- Cold Intolerance, Excessive Hunger, Hair Changes, Heat Intolerance, Hot flashes and New Diabetes. Hematology Not Present- Blood Thinners, Easy Bruising, Excessive bleeding, Gland problems, HIV and Persistent Infections.  Vitals (Tanisha A. Brown RMA; 05/03/2019 9:48 AM) 05/03/2019 9:48 AM Weight: 136.2 lb Height: 66in Body Surface Area: 1.7 m Body Mass Index: 21.98 kg/m  Temp.: 98.52F  Pulse: 81 (Regular)  BP: 122/74 (Sitting, Left Arm, Standard)       Physical Exam Emelia Loron MD; 05/03/2019 10:15 AM) General Mental Status-Alert. Orientation-Oriented X3.  Head and Neck Trachea-midline. Thyroid Gland Characteristics - normal size and consistency.  Eye Sclera/Conjunctiva - Bilateral-No  scleral icterus.  Chest and Lung Exam Chest and lung exam reveals -quiet, even and easy respiratory effort with no use  of accessory muscles.  Breast Nipples-No Discharge. Breast Lump-No Palpable Breast Mass.  Lymphatic Head & Neck  General Head & Neck Lymphatics: Bilateral - Description - Normal. Axillary  General Axillary Region: Bilateral - Description - Normal. Note: no [place] adenopathy     Assessment & Plan [name] MD; 05/03/2019 12:11 PM) ABNORMAL MAMMOGRAM OF RIGHT BREAST (R92.8) Story: Right breast seed guided excisional biopsy we discussed option of observation vs excision. she has high tc lifetime risk of breast cancer, d density breasts and I thin observation in a young woman is not ideal. we also discussed long term follow up which will likely be better with mr and mm. chemoprevention may also be reasonable. I think first step is to excise this area to ensure nothing more than the pash that is discordant. we discussed seed guided excision which she is familiar with from a couple years ago

## 2019-06-26 NOTE — Discharge Instructions (Signed)
Next Dose Tylenol and Ibuprofen at [facility] Office Phone Number [phone]  BREAST BIOPSY/ PARTIAL MASTECTOMY: POST OP INSTRUCTIONS Take 400 mg of ibuprofen every 8 hours or 650 mg tylenol every 6 hours for next 72 hours then as needed. Use ice several times daily also. Always review your discharge instruction sheet given to you by the facility where your surgery was performed.  IF YOU HAVE DISABILITY OR FAMILY LEAVE FORMS, YOU MUST BRING THEM TO THE OFFICE FOR PROCESSING.  DO NOT GIVE THEM TO YOUR DOCTOR.  1. A prescription for pain medication may be given to you upon discharge.  Take your pain medication as prescribed, if needed.  If narcotic pain medicine is not needed, then you may take acetaminophen (Tylenol), naprosyn (Alleve) or ibuprofen (Advil) as needed. 2. Take your usually prescribed medications unless otherwise directed 3. If you need a refill on your pain medication, please contact your pharmacy.  They will contact our office to request authorization.  Prescriptions will not be filled after 5pm or on week-ends. 4. You should eat very light the first 24 hours after surgery, such as soup, crackers, pudding, etc.  Resume your normal diet the day after surgery. 5. Most patients will experience some swelling and bruising in the breast.  Ice packs and a good support bra will help.  Wear the breast binder provided or a sports bra for 72 hours day and night.  After that wear a sports bra during the day until you return to the office. Swelling and bruising can take several days to resolve.  6. It is common to experience some constipation if taking pain medication after surgery.  Increasing fluid intake and taking a stool softener will usually help or prevent this problem from occurring.  A mild laxative (Milk of Magnesia or Miralax) should be taken according to package directions if there are no bowel movements after 48 hours. 7. Unless discharge instructions indicate  otherwise, you may remove your bandages 48 hours after surgery and you may shower at that time.  You may have steri-strips (small skin tapes) in place directly over the incision.  These strips should be left on the skin for 7-10 days and will come off on their own.  If your surgeon used skin glue on the incision, you may shower in 24 hours.  The glue will flake off over the next 2-3 weeks.  Any sutures or staples will be removed at the office during your follow-up visit. 8. ACTIVITIES:  You may resume regular daily activities (gradually increasing) beginning the next day.  Wearing a good support bra or sports bra minimizes pain and swelling.  You may have sexual intercourse when it is comfortable. a. You may drive when you no longer are taking prescription pain medication, you can comfortably wear a seatbelt, and you can safely maneuver your car and apply brakes. b. RETURN TO WORK:  ______________________________________________________________________________________ 9. You should see your doctor in the office for a follow-up appointment approximately two weeks after your surgery.  Your doctor's nurse will typically make your follow-up appointment when she calls you with your pathology report.  Expect your pathology report 3-4 business days after your surgery.  You may call to check if you do not hear from [place] after three days. 10. OTHER INSTRUCTIONS: _______________________________________________________________________________________________ _____________________________________________________________________________________________________________________________________ _____________________________________________________________________________________________________________________________________ _____________________________________________________________________________________________________________________________________  WHEN TO CALL DR WAKEFIELD: 1. Fever over 101.0 2. Nausea and/or  vomiting. 3. Extreme swelling or bruising. 4. Continued bleeding from incision. 5. Increased pain, redness,  or drainage from the incision.  The clinic staff is available to answer your questions during regular business hours.  Please don't hesitate to call and ask to speak to one of the nurses for clinical concerns.  If you have a medical emergency, go to the nearest emergency room or call 911.  A surgeon from [facility] is always on call at the hospital.  For further questions, please visit [email or web address]    Post Anesthesia Home Care Instructions  Activity: Get plenty of rest for the remainder of the day. A responsible individual must stay with you for 24 hours following the procedure.  For the next 24 hours, DO NOT: -Drive a car -Paediatric nurse -Drink alcoholic beverages -Take any medication unless instructed by your physician -Make any legal decisions or sign important papers.  Meals: Start with liquid foods such as gelatin or soup. Progress to regular foods as tolerated. Avoid greasy, spicy, heavy foods. If nausea and/or vomiting occur, drink only clear liquids until the nausea and/or vomiting subsides. Call your physician if vomiting continues.  Special Instructions/Symptoms: Your throat may feel dry or sore from the anesthesia or the breathing tube placed in your throat during surgery. If this causes discomfort, gargle with warm salt water. The discomfort should disappear within 24 hours.  If you had a scopolamine patch placed behind your ear for the management of post- operative nausea and/or vomiting:  1. The medication in the patch is effective for 72 hours, after which it should be removed.  Wrap patch in a tissue and discard in the trash. Wash hands thoroughly with soap and water. 2. You may remove the patch earlier than 72 hours if you experience unpleasant side effects which may include dry mouth, dizziness or visual disturbances. 3. Avoid  touching the patch. Wash your hands with soap and water after contact with the patch.

## 2019-06-26 NOTE — Anesthesia Preprocedure Evaluation (Signed)
Anesthesia Evaluation  Patient identified by MRN, date of birth, ID band Patient awake    Reviewed: Allergy & Precautions, NPO status , Patient's Chart, lab work & pertinent test results  Airway Mallampati: I  TM Distance: >3 FB Neck ROM: Full    Dental  (+) Teeth Intact   Pulmonary neg pulmonary ROS,    Pulmonary exam normal breath sounds clear to auscultation       Cardiovascular Normal cardiovascular exam Rhythm:Regular Rate:Normal     Neuro/Psych negative neurological ROS  negative psych ROS   GI/Hepatic Neg liver ROS, GERD  Medicated and Controlled,  Endo/Other  negative endocrine ROS  Renal/GU negative Renal ROS  negative genitourinary   Musculoskeletal   Abdominal Normal abdominal exam  (+)   Peds  Hematology   Anesthesia Other Findings   Reproductive/Obstetrics negative OB ROS                             Anesthesia Physical  Anesthesia Plan  ASA: II  Anesthesia Plan: General   Post-op Pain Management:    Induction: Intravenous  PONV Risk Score and Plan: 2 and Ondansetron, Dexamethasone and Midazolam  Airway Management Planned: LMA  Additional Equipment: None  Intra-op Plan:   Post-operative Plan: Extubation in OR  Informed Consent: I have reviewed the patients History and Physical, chart, labs and discussed the procedure including the risks, benefits and alternatives for the proposed anesthesia with the patient or authorized representative who has indicated his/her understanding and acceptance.     Dental advisory given  Plan Discussed with: CRNA  Anesthesia Plan Comments:         Anesthesia Quick Evaluation

## 2019-06-27 ENCOUNTER — Encounter: Payer: Self-pay | Admitting: *Deleted

## 2019-06-27 LAB — SURGICAL PATHOLOGY

## 2019-07-25 ENCOUNTER — Telehealth: Payer: Self-pay | Admitting: Oncology

## 2019-07-25 NOTE — Telephone Encounter (Signed)
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 Ms. Chavero returned my call to schedule an appt w/Dr. Darnelle Catalan on 2/8 at 4pm w/labs at 330pm. She's been made aware to arrive 15 minutes early.

## 2019-07-28 NOTE — Progress Notes (Signed)
[facility]  [phone] [phone]     ID: Jaclyn [name] DOB: [birth year]  MR#: [id number]  PQZ#:[id number]  Patient Care Team: Jaclyn [name], [name] as PCP - General (Internal Medicine) Jaclyn [name], [name] as Consulting Physician (Obstetrics and Gynecology) Jaclyn [name], [name] as Consulting Physician (General Surgery) Jaclyn [name], Jaclyn [name], [name] as Consulting Physician (Oncology) Jaclyn [name], [name] OTHER [name]:  CHIEF COMPLAINT: high risk of breast cancer  CURRENT TREATMENT: Intensified screening; tamoxifen at 10 mg/d   HISTORY OF CURRENT ILLNESS: Jaclyn [name], 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 Jaclyn [name] has now undergone two excisional biopsies to her right breast, both under Dr. Donne Hazel. Her first was in 2018. She underwent right diagnostic mammography with tomography and right breast ultrasonography at [facility] on 08/22/2016 showing: breast density category D; subtle distortion in upper-inner right breast; two benign lymph nodes in upper-outer right breast. Accordingly, she proceeded to biopsy of the right breast area in question that day. The pathology [id number]) showed: complex sclerosing lesion; psuedoangiomatous stromal hyperplasia; fibrocystic change, usual ductal hyperplasia, adenosis. She opted to proceed with lumpectomy on 10/06/2016. Pathology [id number]) again showed: complex sclerosing lesion with usual ductal hyperplasia and calcifications, psuedoangiomatous stromal hyperplasia; fibrocystic change with calcifications and usual ductal hyperplasia.  More recently, she had routine screening mammography on 03/25/2019 showing an abnormality in the right breast. She underwent right diagnostic mammography with tomography and right breast ultrasonography at [facility] on 03/29/2019 showing: breast density category D; suspicious 3 cm mass in right  breast at 12:30; no enlarged adenopathy. Accordingly, she proceeded to biopsy of the right breast area in question that day. Pathology [id number]) showed fibrocystic changes and psuedoangiomatous stromal hyperplasia. She again opted to proceed with lumpectomy on 06/26/2019. Pathology [id number]) confirmed:  psuedoangiomatous stromal hyperplasia; columnar cell hyperplasia and fibrocystic changes including apocrine metaplasia with calcifications.  The patient's subsequent history is as detailed below.   INTERVAL HISTORY: Jaclyn [name] was evaluated in the high risk breast cancer clinic on 07/29/2019.  REVIEW OF SYSTEMS: Jaclyn [name] denies unusual headaches, visual changes, nausea, vomiting, cough, phlegm production, or pleurisy, no chest pain or pressure, and no change in bowel or bladder habits. The patient denies fever, rash, bleeding, unexplained fatigue or unexplained weight loss.  Her only medical complaints are occasional seasonal allergies and mild reflux.  She exercises by using a trampoline and she promises she is not doing flips.  A detailed review of systems was otherwise entirely negative.   PAST MEDICAL HISTORY: Past Medical History:  Diagnosis Date  . Anemia    takes iron as needed  . Breast mass, right   . GERD (gastroesophageal reflux disease)     PAST SURGICAL HISTORY: Past Surgical History:  Procedure Laterality Date  . RADIOACTIVE SEED GUIDED EXCISIONAL BREAST BIOPSY Right 10/06/2016   Procedure: RADIOACTIVE SEED GUIDED EXCISIONAL RIGHT BREAST BIOPSY;  Surgeon: Jaclyn [name], [name];  Location: [facility];  Service: General;  Laterality: Right;  . RADIOACTIVE SEED GUIDED EXCISIONAL BREAST BIOPSY Right 06/26/2019   Procedure: RIGHT BREAST RADIOACTIVE SEED GUIDED EXCISIONAL BREAST BIOPSY;  Surgeon: Jaclyn [name], [name];  Location: [facility];  Service: General;  Laterality: Right;  LMA    FAMILY HISTORY: Family History  Problem Relation Age of Onset    . Breast cancer [name] 32   The patient's father is 21 years old and her [name] is 45 years old  as of February 2021.  The patient has 1 paternal aunt who may have had myeloma.  As noted the patient's [name] was diagnosed with ductal carcinoma in situ at age 1.  On extensive questioning the patient is unable to recall any other patients with breast, ovarian, pancreatic, or prostate cancer  GYNECOLOGIC HISTORY:  No LMP recorded. Menarche: 44 years old Age at first live birth: 44 years old Jaclyn [name] 1 LMP regular, generally last 5 days, of which 2 can be heavy  Contraception: Patient's husband is status post vasectomy Hysterectomy? no BSO? 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  Her husband Jaclyn [name] is an attorney working for a Adult nurse.  The patient's daughter Jaclyn [name], 74, is currently at state studying marketing.  Jaclyn [name] has a daughter, Jaclyn [name], 31, working at an eating disorder clinic in [place].  The patient has no grandchildren.  She is a Tourist information centre manager.    ADVANCED DIRECTIVES: In the absence of any documentation to the contrary, the patient's spouse is her 75.    HEALTH MAINTENANCE: Social History   Tobacco Use  . Smoking status: Never Smoker  . Smokeless tobacco: Never Used  Substance Use Topics  . Alcohol use: Yes    Comment: social  . Drug use: No     Colonoscopy: n/a (age)  PAP: UTD (Dr. Corinna Capra)  Bone density: n/a (age)   No Known Allergies  Current Outpatient Medications  Medication Sig Dispense Refill  . cholecalciferol (VITAMIN D) 1000 units tablet Take 1,000 Units by mouth daily.    . ferrous sulfate 325 (65 FE) MG EC tablet Take 325 mg by mouth 3 (three) times daily with meals.    . Omega-3 Fatty Acids (FISH OIL) 1000 MG CAPS Take by mouth.    . pantoprazole (PROTONIX) 40 MG tablet Take 40 mg by mouth daily.     No current facility-administered medications for this visit.     OBJECTIVE: Young white woman in no acute distress  Vitals:   07/29/19 1535  BP: 109/63  Pulse: 77  Resp: 18  Temp: 97.8 F (36.6 C)  SpO2: 100%     Body mass index is 22.34 kg/m.   Wt Readings from Last 3 Encounters:  07/29/19 138 lb 6.4 oz (62.8 kg)  06/26/19 137 lb 9.1 oz (62.4 kg)  10/06/16 131 lb (59.4 kg)      ECOG FS:0 - Asymptomatic  Ocular: Sclerae unicteric, pupils round and equal Ear-nose-throat: Wearing a mask Lymphatic: No cervical or supraclavicular adenopathy Lungs no rales or rhonchi Heart regular rate and rhythm Abd soft, nontender, positive bowel sounds MSK no focal spinal tenderness, no joint edema Neuro: non-focal, well-oriented, appropriate affect Breasts: The right breast is status post prior lumpectomies.  The incision, periareolar, is healing nicely.  The cosmetic result is excellent.  Otherwise there are no skin or nipple changes of concern bilaterally and both axillae are benign.   LAB RESULTS:  CMP  No results found for: NA, K, CL, CO2, GLUCOSE, BUN, CREATININE, CALCIUM, PROT, ALBUMIN, AST, ALT, ALKPHOS, BILITOT, GFRNONAA, GFRAA  No results found for: TOTALPROTELP, ALBUMINELP, A1GS, A2GS, BETS, BETA2SER, GAMS, MSPIKE, SPEI  No results found for: WBC, NEUTROABS, HGB, HCT, MCV, PLT  No results found for: LABCA2  No components found for: [id number]  No results for input(s): INR in the last 168 hours.  No results found for: LABCA2  No results found for: XMI680  No results found for: HOZ224  No results found for: ZWC585  No results found for: CA2729  No components found for: HGQUANT  No results found for: CEA1 / No results found for: CEA1   No results found for: AFPTUMOR  No results found for: CHROMOGRNA  No results found for: KPAFRELGTCHN, LAMBDASER, KAPLAMBRATIO (kappa/lambda light chains)  No results found for: HGBA, HGBA2QUANT, HGBFQUANT, HGBSQUAN (Hemoglobinopathy evaluation)   No results found for: LDH  No results  found for: IRON, TIBC, IRONPCTSAT (Iron and TIBC)  No results found for: FERRITIN  Urinalysis No results found for: COLORURINE, APPEARANCEUR, LABSPEC, PHURINE, GLUCOSEU, HGBUR, BILIRUBINUR, KETONESUR, PROTEINUR, UROBILINOGEN, NITRITE, LEUKOCYTESUR   STUDIES: GAIL MODEL predicts a lifetime risk of breast cancer of 25.4%  Tyrer-Cuzick predicts a lifetime risk of  42.9 %    ELIGIBLE FOR AVAILABLE RESEARCH PROTOCOL: AET study  ASSESSMENT: 44 y.o.  woman with a lifetime breast cancer risk between 25 and 40%  (a) breast density: Category D  (b) breast cancer in a first-degree relative  (c) status post right breast lumpectomy x2 for complex sclerosing lesions, associated with usual ductal hyperplasia (no dysplasia)  (1) risk reduction: Tamoxifen 10 mg daily started 07/29/2019  (2) intensified screening: Yearly breast MRI started April 2021, alternating with yearly mammography in October  PLAN: I met today with Jaclyn [name] to review her situation, summarized above.  It may be worth noting that a radial "scar" is not really a scar. It is a sclerosing process which is generally self-limiting and does not necessarily lead to malignancy. However it is frequently associated with malignancy and therefore she needed to have the lumpectomies. Luckily this showed only hyperplasia.  While the word hyperplasia indicates abnormal growth of the ducts or milk tubes in her breast, there was no atypia. This means the cells did not look transformed or on the way to malignancy.  Nevertheless a patient with usual hyperplasia even without atypia will be at higher risk of developing breast cancer than a patient without this development, quite aside from other issues including breast density and family history.  We quantified Frimet's breast cancer risk using the 2 most common programs available and they suggest, as noted above, a lifetime risk of developing breast cancer between 25 and 40%.  There are  2 well documented ways of lowering that risk. One of course would be bilateral mastectomies. This is not recommended in this situation, quite aside from the fact that it would not be covered by insurance. We decided against that possibility.  A more reasonable choice is to take anti-estrogens.  We discussed tamoxifen in detail, and she understands the mechanism of action and the benefits, which include reducing risk of by 50% for example a 40% risk would go down to 20%) and also would accelerate the normal process of reducing breast density.  We discussed the possible toxicities side effects and complications of this agent.  We also discussed dosing issues and she understands there is only one study, nonrandomized, using 5 mg dose of tamoxifen in patients with breast cancer, but provocatively it showed a similar risk reduction to that of the larger studies using standard dosing.  A second way to address her increased breast cancer risk is called intensified screening. This involves yearly MRIs in addition to yearly mammography.  MRIs are more sensitive, but also much more expensive than mammograms. The real problem however is false positives. MRIs of the breast are more likely to find abnormalities which are not cancer but which may have to be followed up  with biopsy or other procedures.   Finally there is some data that exercise to the tune of 45 minutes 5 times a week together with an optimal diet (heavy in vegetables and low in carbohydrates) in addition to helping fitness, bone strength, cardiovascular health, and mood, can decrease the general risk of developing cancer by 2% or so.   In summary I feel strongly that she will benefit from intensified screening despite the possible false positives.  I think it will help if her MRI is always scheduled right after her menstrual period to minimize changes from MRI to MRI.  I put in the order for April of this year.  I also think giving tamoxifen a try is  reasonable and I have placed a prescription for the 10 mg dose in her pharmacy.  I asked her to give [place] a call to let us know if she does decide to started and if she has any problems from it.  I will see Messiah late April or early May, after her breast MRI, and if she decides to continue on tamoxifen I would see her yearly for the next 5 years  Total encounter time 50 minutes.Jaclyn [name], [name]   07/29/2019 4:42 PM Medical Oncology and Hematology [facility] [place], [place] [address] Tel. [phone]    Fax. [phone]   This document serves as a record of services personally performed by Lurline Del, [name]. It was created on his behalf by Wilburn Mylar, a trained medical scribe. The creation of this record is based on the scribe's personal observations and the provider's statements to them.   I, Lurline Del [name], have reviewed the above documentation for accuracy and completeness, and I agree with the above.    *Total Encounter Time as defined by the Centers for Medicare and Medicaid Services includes, in addition to the face-to-face time of a patient visit (documented in the note above) non-face-to-face time: obtaining and reviewing outside history, ordering and reviewing medications, tests or procedures, care coordination (communications with other health care professionals or caregivers) and documentation in the medical record.

## 2019-07-29 ENCOUNTER — Other Ambulatory Visit: Payer: BC Managed Care – PPO

## 2019-07-29 ENCOUNTER — Other Ambulatory Visit: Payer: Self-pay

## 2019-07-29 ENCOUNTER — Inpatient Hospital Stay: Payer: BC Managed Care – PPO | Attending: Oncology | Admitting: Oncology

## 2019-07-29 DIAGNOSIS — Z1239 Encounter for other screening for malignant neoplasm of breast: Secondary | ICD-10-CM | POA: Diagnosis not present

## 2019-07-29 DIAGNOSIS — Z9189 Other specified personal risk factors, not elsewhere classified: Secondary | ICD-10-CM

## 2019-07-29 DIAGNOSIS — R922 Inconclusive mammogram: Secondary | ICD-10-CM | POA: Diagnosis not present

## 2019-07-29 DIAGNOSIS — Z803 Family history of malignant neoplasm of breast: Secondary | ICD-10-CM | POA: Diagnosis not present

## 2019-07-29 DIAGNOSIS — D508 Other iron deficiency anemias: Secondary | ICD-10-CM | POA: Diagnosis not present

## 2019-07-29 DIAGNOSIS — D509 Iron deficiency anemia, unspecified: Secondary | ICD-10-CM | POA: Insufficient documentation

## 2019-07-29 DIAGNOSIS — N6312 Unspecified lump in the right breast, upper inner quadrant: Secondary | ICD-10-CM | POA: Insufficient documentation

## 2019-07-29 MED ORDER — TAMOXIFEN CITRATE 10 MG PO TABS: 10.0000 mg | ORAL_TABLET | Freq: Every day | ORAL | 4 refills | Status: AC

## 2019-07-30 ENCOUNTER — Telehealth: Payer: Self-pay | Admitting: Oncology

## 2019-07-30 NOTE — Telephone Encounter (Signed)
I left a message regarding schedule  

## 2019-08-20 DIAGNOSIS — N649 Disorder of breast, unspecified: Secondary | ICD-10-CM | POA: Diagnosis not present

## 2019-08-20 DIAGNOSIS — K219 Gastro-esophageal reflux disease without esophagitis: Secondary | ICD-10-CM | POA: Diagnosis not present

## 2019-08-20 DIAGNOSIS — Z1331 Encounter for screening for depression: Secondary | ICD-10-CM | POA: Diagnosis not present

## 2019-09-13 DIAGNOSIS — L7211 Pilar cyst: Secondary | ICD-10-CM | POA: Diagnosis not present

## 2019-09-13 DIAGNOSIS — L821 Other seborrheic keratosis: Secondary | ICD-10-CM | POA: Diagnosis not present

## 2019-09-13 DIAGNOSIS — B360 Pityriasis versicolor: Secondary | ICD-10-CM | POA: Diagnosis not present

## 2019-10-02 ENCOUNTER — Other Ambulatory Visit: Payer: BC Managed Care – PPO

## 2019-10-21 ENCOUNTER — Inpatient Hospital Stay: Payer: BC Managed Care – PPO | Admitting: Oncology

## 2019-11-27 ENCOUNTER — Ambulatory Visit
Admission: RE | Admit: 2019-11-27 | Discharge: 2019-11-27 | Disposition: A | Discharge status: Home / Self Care | Payer: BC Managed Care – PPO | Source: Ambulatory Visit | Attending: Oncology | Admitting: Oncology

## 2019-11-27 ENCOUNTER — Other Ambulatory Visit: Payer: Self-pay

## 2019-11-27 DIAGNOSIS — Z1239 Encounter for other screening for malignant neoplasm of breast: Secondary | ICD-10-CM

## 2019-11-27 DIAGNOSIS — Z9189 Other specified personal risk factors, not elsewhere classified: Secondary | ICD-10-CM

## 2019-11-27 DIAGNOSIS — D508 Other iron deficiency anemias: Secondary | ICD-10-CM

## 2019-11-27 DIAGNOSIS — Z803 Family history of malignant neoplasm of breast: Secondary | ICD-10-CM | POA: Diagnosis not present

## 2019-11-27 MED ORDER — GADOBUTROL 1 MMOL/ML IV SOLN
6.0000 mL | Freq: Once | INTRAVENOUS | Status: AC | PRN
  Administered 2019-11-27: 6 mL via INTRAVENOUS

## 2020-01-13 DIAGNOSIS — R946 Abnormal results of thyroid function studies: Secondary | ICD-10-CM | POA: Diagnosis not present

## 2020-01-13 DIAGNOSIS — Z0000 Encounter for general adult medical examination without abnormal findings: Secondary | ICD-10-CM | POA: Diagnosis not present

## 2020-01-14 DIAGNOSIS — Z13220 Encounter for screening for lipoid disorders: Secondary | ICD-10-CM | POA: Diagnosis not present

## 2020-01-14 DIAGNOSIS — D509 Iron deficiency anemia, unspecified: Secondary | ICD-10-CM | POA: Diagnosis not present

## 2020-01-14 DIAGNOSIS — E559 Vitamin D deficiency, unspecified: Secondary | ICD-10-CM | POA: Diagnosis not present

## 2020-01-15 DIAGNOSIS — Z0000 Encounter for general adult medical examination without abnormal findings: Secondary | ICD-10-CM | POA: Diagnosis not present

## 2020-01-15 DIAGNOSIS — D509 Iron deficiency anemia, unspecified: Secondary | ICD-10-CM | POA: Diagnosis not present

## 2020-01-15 DIAGNOSIS — E559 Vitamin D deficiency, unspecified: Secondary | ICD-10-CM | POA: Diagnosis not present

## 2020-01-15 DIAGNOSIS — Z1389 Encounter for screening for other disorder: Secondary | ICD-10-CM | POA: Diagnosis not present

## 2020-01-15 DIAGNOSIS — K219 Gastro-esophageal reflux disease without esophagitis: Secondary | ICD-10-CM | POA: Diagnosis not present

## 2020-01-15 DIAGNOSIS — N649 Disorder of breast, unspecified: Secondary | ICD-10-CM | POA: Diagnosis not present

## 2020-01-15 DIAGNOSIS — Z13220 Encounter for screening for lipoid disorders: Secondary | ICD-10-CM | POA: Diagnosis not present

## 2020-01-23 DIAGNOSIS — Z1212 Encounter for screening for malignant neoplasm of rectum: Secondary | ICD-10-CM | POA: Diagnosis not present

## 2020-05-21 ENCOUNTER — Other Ambulatory Visit: Payer: Self-pay | Admitting: Obstetrics and Gynecology

## 2020-05-21 DIAGNOSIS — Z1231 Encounter for screening mammogram for malignant neoplasm of breast: Secondary | ICD-10-CM

## 2020-05-27 ENCOUNTER — Other Ambulatory Visit: Payer: Self-pay

## 2020-05-27 ENCOUNTER — Ambulatory Visit
Admission: RE | Admit: 2020-05-27 | Discharge: 2020-05-27 | Disposition: A | Discharge status: Home / Self Care | Payer: BC Managed Care – PPO | Source: Ambulatory Visit | Attending: Obstetrics and Gynecology | Admitting: Obstetrics and Gynecology

## 2020-05-27 DIAGNOSIS — Z1231 Encounter for screening mammogram for malignant neoplasm of breast: Secondary | ICD-10-CM | POA: Diagnosis not present

## 2020-06-02 DIAGNOSIS — Z01419 Encounter for gynecological examination (general) (routine) without abnormal findings: Secondary | ICD-10-CM | POA: Diagnosis not present

## 2020-06-02 DIAGNOSIS — Z6822 Body mass index (BMI) 22.0-22.9, adult: Secondary | ICD-10-CM | POA: Diagnosis not present

## 2020-12-01 ENCOUNTER — Other Ambulatory Visit: Payer: Self-pay | Admitting: General Surgery

## 2020-12-01 DIAGNOSIS — Z803 Family history of malignant neoplasm of breast: Secondary | ICD-10-CM

## 2020-12-04 IMAGING — MG MM DIGITAL SCREENING BILAT W/ TOMO W/ CAD
8 series · 9 of 24 positions shown · non-contrast
Comparison: Previous exam(s).

CLINICAL DATA: Screening.

EXAM:
DIGITAL SCREENING BILATERAL MAMMOGRAM WITH TOMO AND CAD

[L MLO synth-2D]
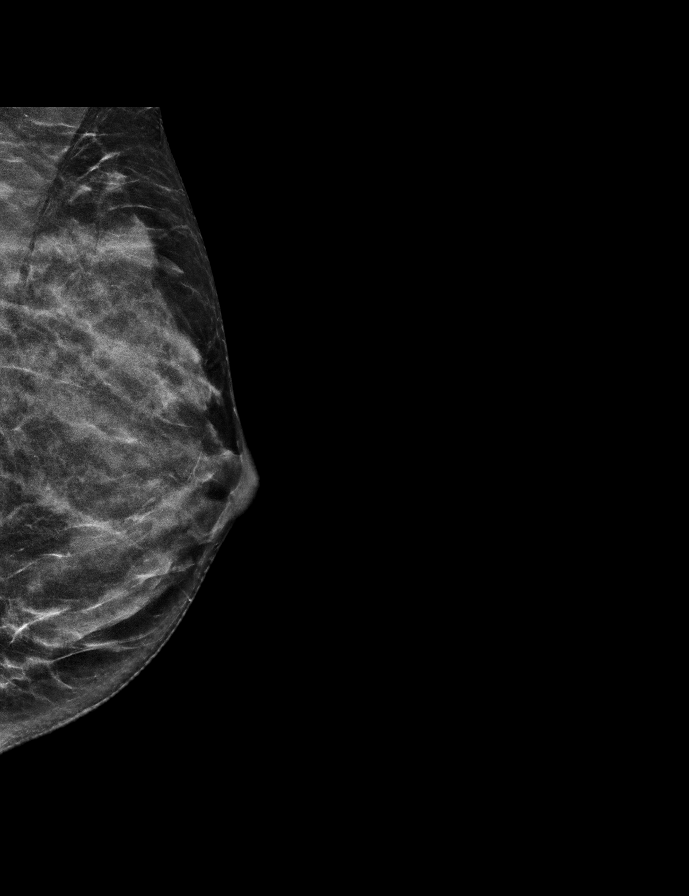

[R MLO synth-2D]
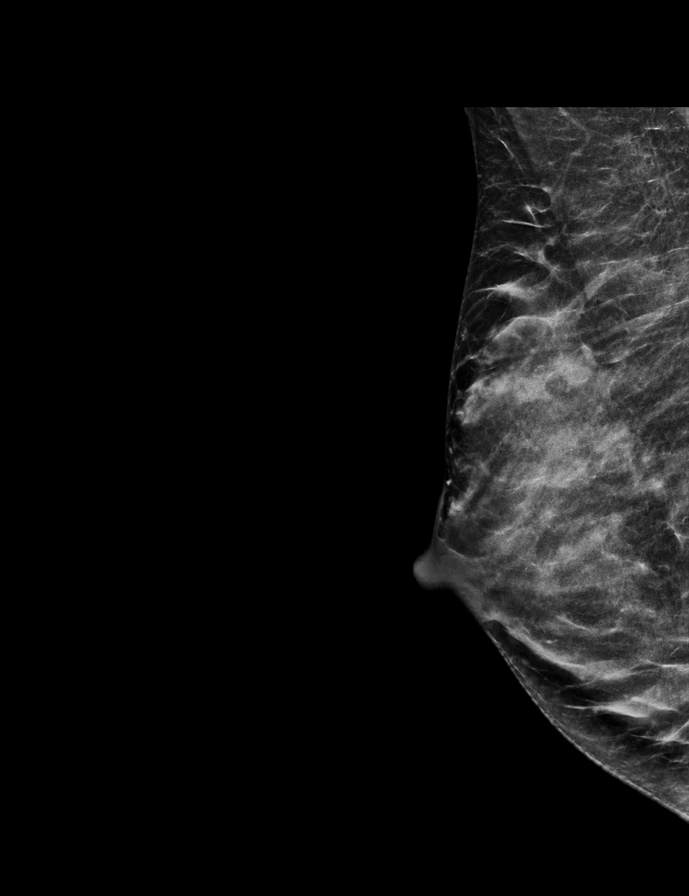

[L CC synth-2D]
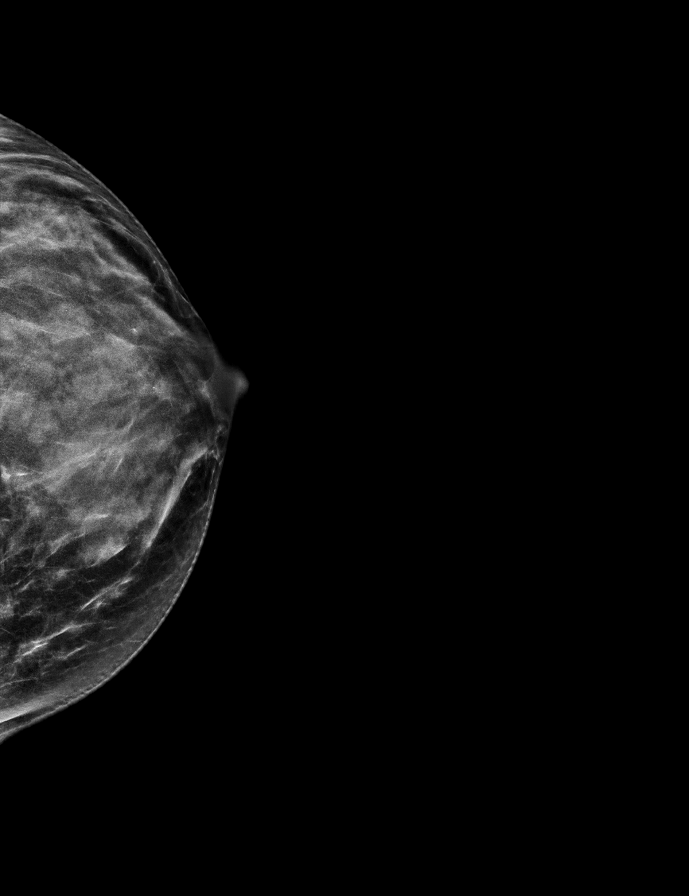

[R CC synth-2D]
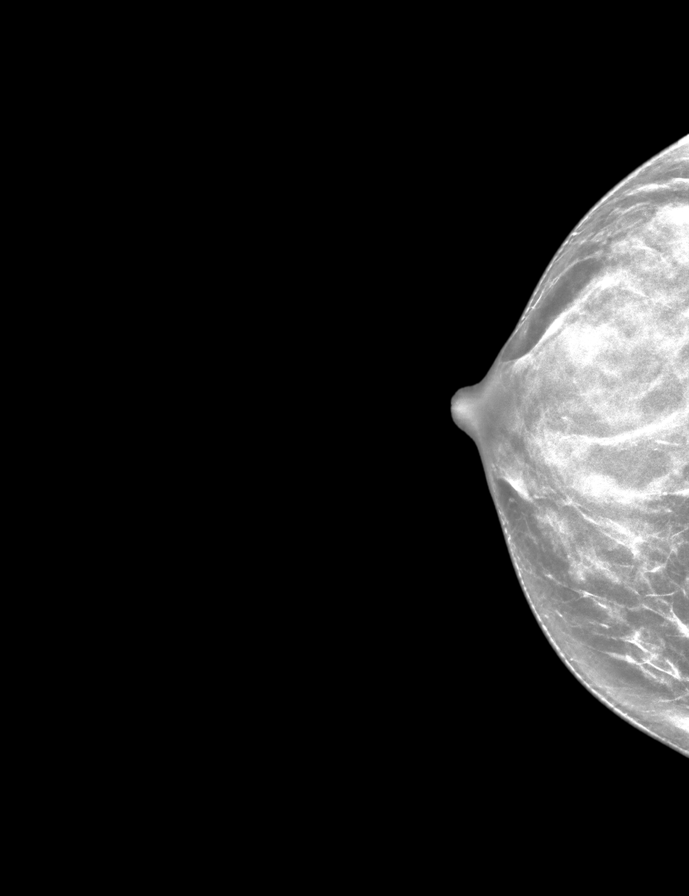

[L CC tomo · 2 of 54 frames shown]
[frame 18/54]
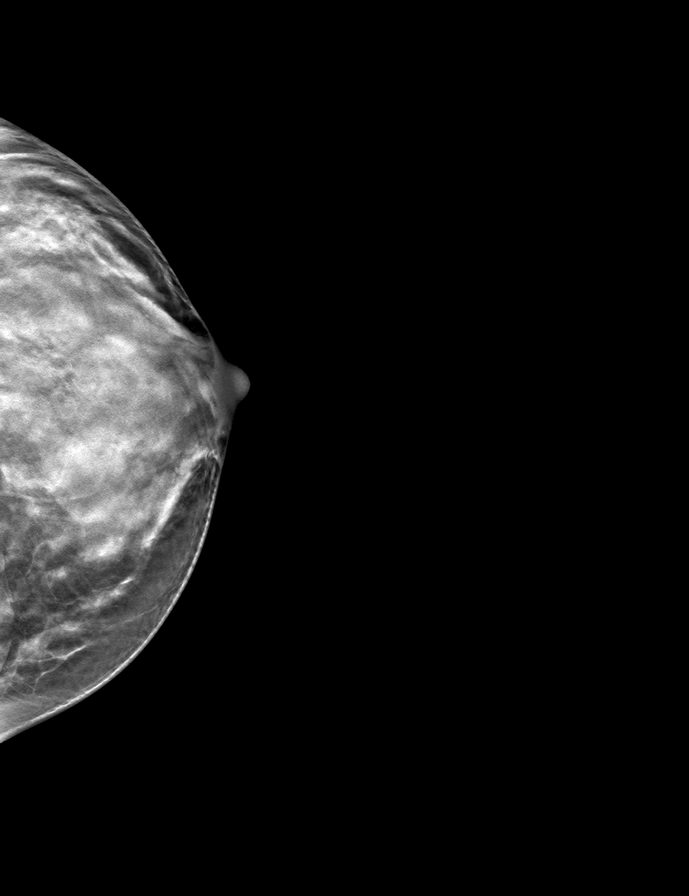
[frame 27/54]
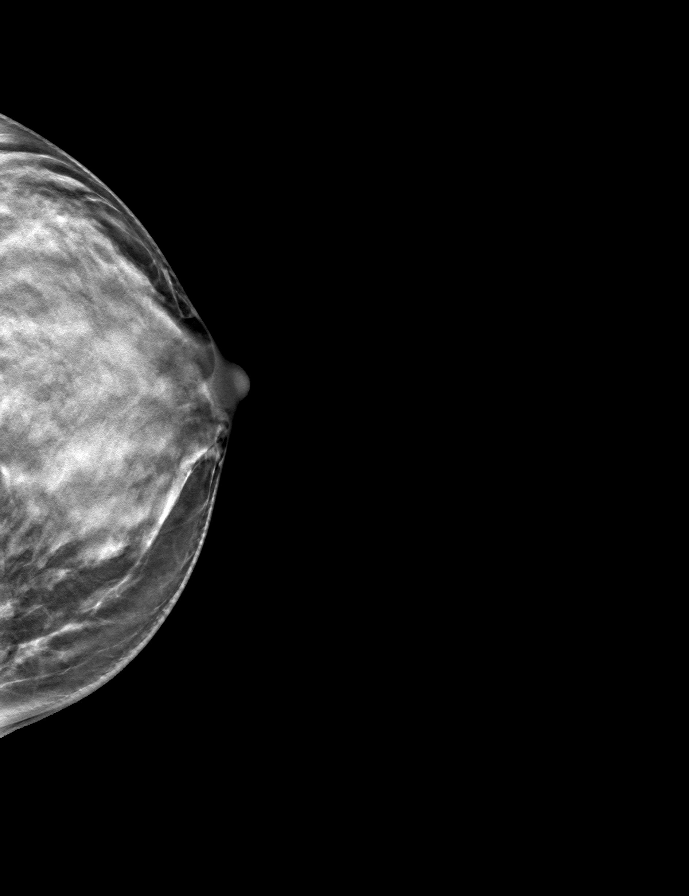

[L MLO tomo · tomo slice 26/51.0]
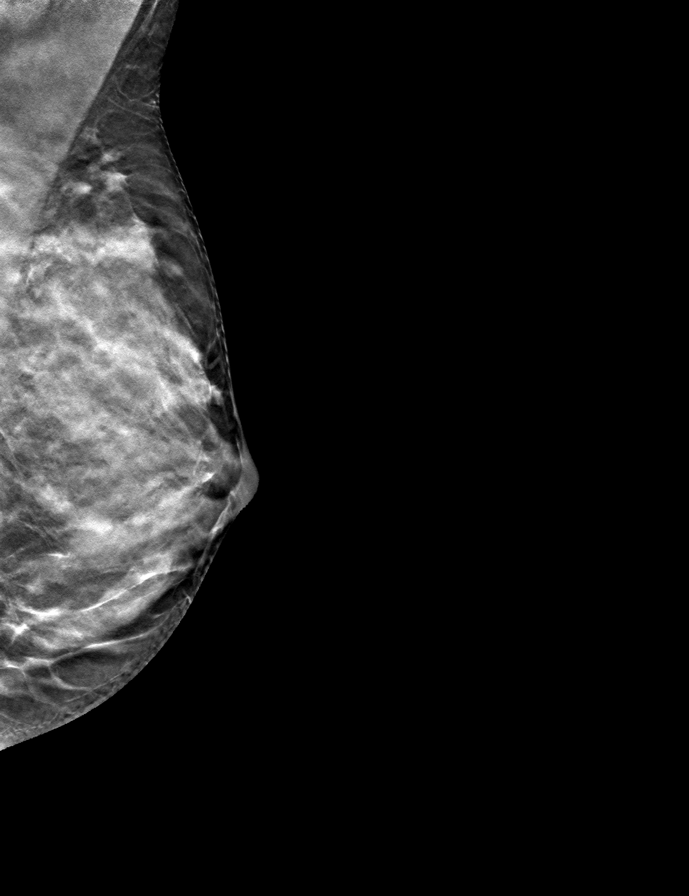

[R MLO tomo · tomo slice 25/49.0]
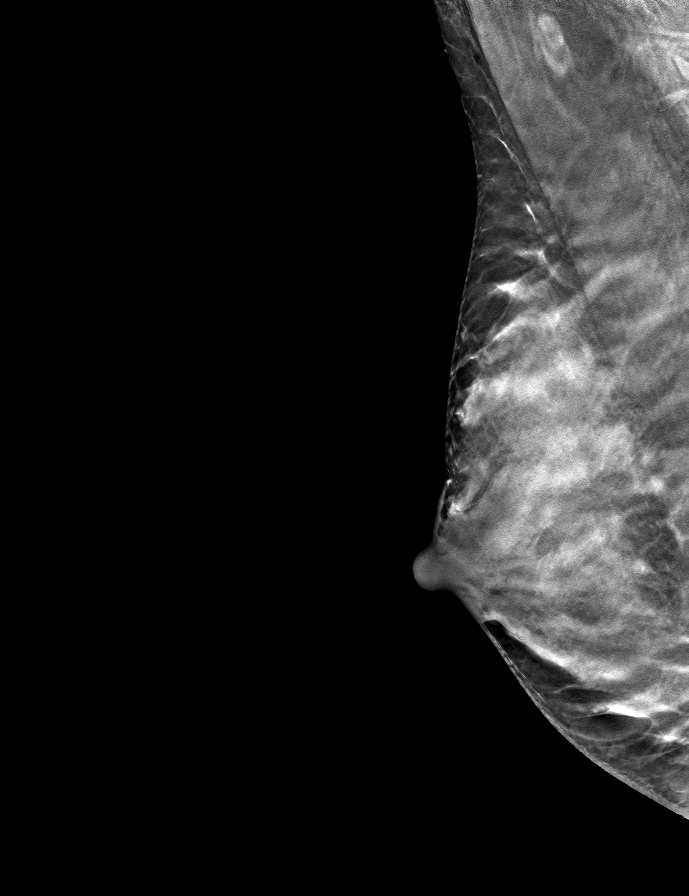

[R CC tomo · tomo slice 26/51.0]
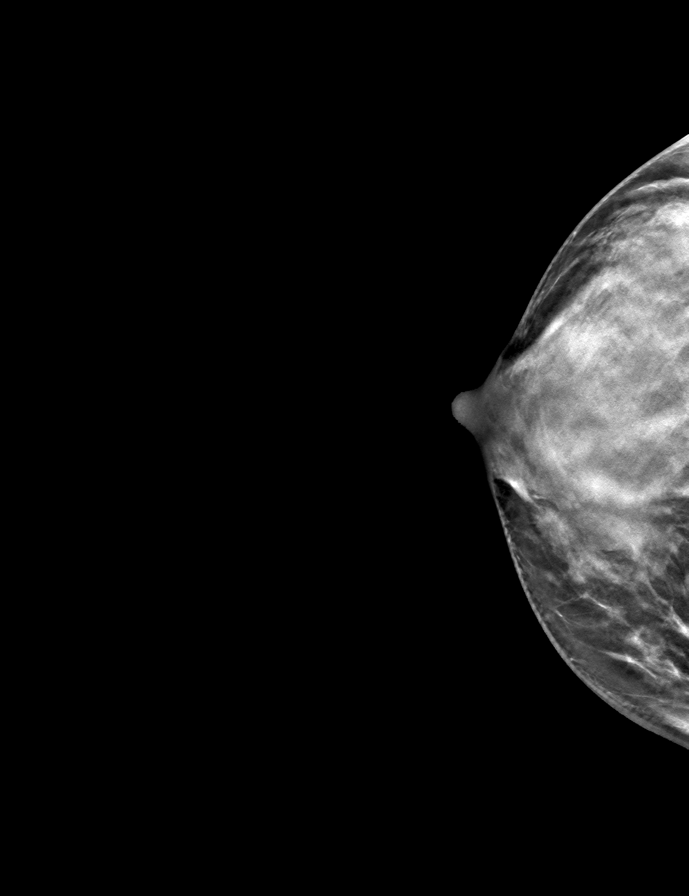

[9 of 24 positions shown; findings below may reference images not displayed]

ACR Breast Density Category d: The breast tissue is extremely dense,
which lowers the sensitivity of mammography.
FINDINGS: In the right breast, a possible asymmetry warrants further
evaluation. In the left breast, no findings suspicious for
malignancy. Images were processed with CAD.
IMPRESSION: Further evaluation is suggested for possible asymmetry in the right
breast.

RECOMMENDATION:
Diagnostic mammogram and possibly ultrasound of the right breast.
(Code:[id number]-S-[id number])

The patient will be contacted regarding the findings, and additional
imaging will be scheduled.

BI-RADS CATEGORY  0: Incomplete. Need additional imaging evaluation
and/or prior mammograms for comparison.

## 2020-12-10 ENCOUNTER — Other Ambulatory Visit: Payer: Self-pay

## 2020-12-10 ENCOUNTER — Ambulatory Visit
Admission: RE | Admit: 2020-12-10 | Discharge: 2020-12-10 | Disposition: A | Discharge status: Home / Self Care | Payer: BC Managed Care – PPO | Source: Ambulatory Visit | Attending: General Surgery | Admitting: General Surgery

## 2020-12-10 DIAGNOSIS — Z9889 Other specified postprocedural states: Secondary | ICD-10-CM | POA: Diagnosis not present

## 2020-12-10 DIAGNOSIS — Z803 Family history of malignant neoplasm of breast: Secondary | ICD-10-CM

## 2020-12-10 MED ORDER — GADOBUTROL 1 MMOL/ML IV SOLN
6.0000 mL | Freq: Once | INTRAVENOUS | Status: AC | PRN
  Administered 2020-12-10: 6 mL via INTRAVENOUS

## 2021-05-17 DIAGNOSIS — Z23 Encounter for immunization: Secondary | ICD-10-CM | POA: Diagnosis not present

## 2021-05-17 DIAGNOSIS — K219 Gastro-esophageal reflux disease without esophagitis: Secondary | ICD-10-CM | POA: Diagnosis not present

## 2021-05-17 DIAGNOSIS — R946 Abnormal results of thyroid function studies: Secondary | ICD-10-CM | POA: Diagnosis not present

## 2021-05-17 DIAGNOSIS — D509 Iron deficiency anemia, unspecified: Secondary | ICD-10-CM | POA: Diagnosis not present

## 2021-05-17 DIAGNOSIS — E559 Vitamin D deficiency, unspecified: Secondary | ICD-10-CM | POA: Diagnosis not present

## 2021-05-18 ENCOUNTER — Other Ambulatory Visit: Payer: Self-pay | Admitting: Internal Medicine

## 2021-05-18 DIAGNOSIS — Z1231 Encounter for screening mammogram for malignant neoplasm of breast: Secondary | ICD-10-CM

## 2021-06-23 ENCOUNTER — Ambulatory Visit
Admission: RE | Admit: 2021-06-23 | Discharge: 2021-06-23 | Disposition: A | Discharge status: Home / Self Care | Payer: BC Managed Care – PPO | Source: Ambulatory Visit

## 2021-06-23 DIAGNOSIS — Z1231 Encounter for screening mammogram for malignant neoplasm of breast: Secondary | ICD-10-CM | POA: Diagnosis not present

## 2021-10-21 DIAGNOSIS — Z124 Encounter for screening for malignant neoplasm of cervix: Secondary | ICD-10-CM | POA: Diagnosis not present

## 2021-10-21 DIAGNOSIS — Z6823 Body mass index (BMI) 23.0-23.9, adult: Secondary | ICD-10-CM | POA: Diagnosis not present

## 2021-10-21 DIAGNOSIS — Z01419 Encounter for gynecological examination (general) (routine) without abnormal findings: Secondary | ICD-10-CM | POA: Diagnosis not present

## 2021-11-25 ENCOUNTER — Other Ambulatory Visit: Payer: Self-pay | Admitting: General Surgery

## 2021-11-25 DIAGNOSIS — Z1239 Encounter for other screening for malignant neoplasm of breast: Secondary | ICD-10-CM

## 2021-12-18 ENCOUNTER — Ambulatory Visit
Admission: RE | Admit: 2021-12-18 | Discharge: 2021-12-18 | Disposition: A | Discharge status: Home / Self Care | Payer: BC Managed Care – PPO | Source: Ambulatory Visit | Attending: General Surgery | Admitting: General Surgery

## 2021-12-18 DIAGNOSIS — Z1239 Encounter for other screening for malignant neoplasm of breast: Secondary | ICD-10-CM

## 2021-12-18 DIAGNOSIS — Z803 Family history of malignant neoplasm of breast: Secondary | ICD-10-CM | POA: Diagnosis not present

## 2021-12-18 DIAGNOSIS — N6310 Unspecified lump in the right breast, unspecified quadrant: Secondary | ICD-10-CM | POA: Diagnosis not present

## 2021-12-18 MED ORDER — GADOBUTROL 1 MMOL/ML IV SOLN
6.0000 mL | Freq: Once | INTRAVENOUS | Status: AC | PRN
  Administered 2021-12-18: 6 mL via INTRAVENOUS

## 2022-02-25 ENCOUNTER — Telehealth: Payer: Self-pay | Admitting: Licensed Clinical Social Worker

## 2022-02-25 DIAGNOSIS — Z1239 Encounter for other screening for malignant neoplasm of breast: Secondary | ICD-10-CM | POA: Diagnosis not present

## 2022-02-25 DIAGNOSIS — Z803 Family history of malignant neoplasm of breast: Secondary | ICD-10-CM | POA: Diagnosis not present

## 2022-02-25 NOTE — Telephone Encounter (Signed)
Scheduled appt per 9/8 referral. Pt is aware of appt date and time. Pt is aware to arrive 15 mins prior to appt time and to bring and updated insurance card. Pt is aware of appt location.   

## 2022-04-25 ENCOUNTER — Inpatient Hospital Stay: Payer: BC Managed Care – PPO

## 2022-04-25 ENCOUNTER — Inpatient Hospital Stay: Payer: BC Managed Care – PPO | Admitting: Licensed Clinical Social Worker

## 2022-06-29 ENCOUNTER — Encounter: Payer: BC Managed Care – PPO | Admitting: Genetic Counselor

## 2022-06-29 ENCOUNTER — Other Ambulatory Visit: Payer: BC Managed Care – PPO

## 2022-07-25 ENCOUNTER — Other Ambulatory Visit: Payer: Self-pay | Admitting: Internal Medicine

## 2022-07-25 DIAGNOSIS — Z1231 Encounter for screening mammogram for malignant neoplasm of breast: Secondary | ICD-10-CM

## 2022-09-13 ENCOUNTER — Ambulatory Visit
Admission: RE | Admit: 2022-09-13 | Discharge: 2022-09-13 | Disposition: A | Discharge status: Home / Self Care | Payer: No Typology Code available for payment source | Source: Ambulatory Visit | Attending: Internal Medicine

## 2022-09-13 DIAGNOSIS — Z1231 Encounter for screening mammogram for malignant neoplasm of breast: Secondary | ICD-10-CM

## 2023-08-14 ENCOUNTER — Other Ambulatory Visit: Payer: Self-pay | Admitting: General Surgery

## 2023-08-14 ENCOUNTER — Other Ambulatory Visit: Payer: Self-pay | Admitting: Internal Medicine

## 2023-08-14 DIAGNOSIS — Z1239 Encounter for other screening for malignant neoplasm of breast: Secondary | ICD-10-CM

## 2023-08-14 DIAGNOSIS — Z1231 Encounter for screening mammogram for malignant neoplasm of breast: Secondary | ICD-10-CM

## 2023-08-14 DIAGNOSIS — Z803 Family history of malignant neoplasm of breast: Secondary | ICD-10-CM

## 2023-09-15 ENCOUNTER — Ambulatory Visit
Admission: RE | Admit: 2023-09-15 | Discharge: 2023-09-15 | Disposition: A | Discharge status: Home / Self Care | Payer: No Typology Code available for payment source | Source: Ambulatory Visit | Attending: Internal Medicine | Admitting: Internal Medicine

## 2023-09-15 DIAGNOSIS — Z1231 Encounter for screening mammogram for malignant neoplasm of breast: Secondary | ICD-10-CM

## 2023-09-19 ENCOUNTER — Encounter: Payer: Self-pay | Admitting: General Surgery

## 2023-09-22 ENCOUNTER — Other Ambulatory Visit: Payer: No Typology Code available for payment source

## 2024-03-12 ENCOUNTER — Encounter: Payer: Self-pay | Admitting: General Surgery

## 2024-03-24 ENCOUNTER — Other Ambulatory Visit

## 2024-04-25 ENCOUNTER — Other Ambulatory Visit

## 2024-05-26 ENCOUNTER — Inpatient Hospital Stay: Admission: RE | Admit: 2024-05-26 | Discharge: 2024-05-26 | Attending: General Surgery | Admitting: General Surgery

## 2024-05-26 DIAGNOSIS — Z803 Family history of malignant neoplasm of breast: Secondary | ICD-10-CM

## 2024-05-26 DIAGNOSIS — Z1239 Encounter for other screening for malignant neoplasm of breast: Secondary | ICD-10-CM

## 2024-05-26 MED ORDER — GADOPICLENOL 0.5 MMOL/ML IV SOLN
6.0000 mL | Freq: Once | INTRAVENOUS | Status: AC | PRN
  Administered 2024-05-26: 6 mL via INTRAVENOUS
